# Patient Record
Sex: Male | Born: 1965 | Race: Black or African American | Hispanic: No | Marital: Married | Smoking: Never smoker
Health system: Southern US, Community
[De-identification: ages and names within clinical notes are randomized; demographics above are authoritative.]

## PROBLEM LIST (undated history)

## (undated) DIAGNOSIS — J45909 Unspecified asthma, uncomplicated: Secondary | ICD-10-CM

## (undated) DIAGNOSIS — R0683 Snoring: Secondary | ICD-10-CM

## (undated) DIAGNOSIS — G4733 Obstructive sleep apnea (adult) (pediatric): Principal | ICD-10-CM

## (undated) DIAGNOSIS — E785 Hyperlipidemia, unspecified: Secondary | ICD-10-CM

## (undated) DIAGNOSIS — R0681 Apnea, not elsewhere classified: Secondary | ICD-10-CM

## (undated) DIAGNOSIS — Z9989 Dependence on other enabling machines and devices: Principal | ICD-10-CM

## (undated) DIAGNOSIS — I10 Essential (primary) hypertension: Secondary | ICD-10-CM

## (undated) HISTORY — DX: Obstructive sleep apnea (adult) (pediatric): G47.33

## (undated) HISTORY — DX: Snoring: R06.83

## (undated) HISTORY — DX: Unspecified asthma, uncomplicated: J45.909

## (undated) HISTORY — DX: Apnea, not elsewhere classified: R06.81

## (undated) HISTORY — DX: Dependence on other enabling machines and devices: Z99.89

## (undated) HISTORY — DX: Hyperlipidemia, unspecified: E78.5

## (undated) HISTORY — PX: THROAT SURGERY: SHX803

---

## 2004-02-27 ENCOUNTER — Encounter: Admission: RE | Admit: 2004-02-27 | Discharge: 2004-02-27 | Payer: Self-pay | Admitting: Specialist

## 2007-09-20 ENCOUNTER — Ambulatory Visit (HOSPITAL_COMMUNITY): Admission: RE | Admit: 2007-09-20 | Discharge: 2007-09-20 | Payer: Self-pay | Admitting: Otolaryngology

## 2007-09-20 ENCOUNTER — Encounter (INDEPENDENT_AMBULATORY_CARE_PROVIDER_SITE_OTHER): Payer: Self-pay | Admitting: Otolaryngology

## 2010-11-09 NOTE — Op Note (Signed)
[name], Edward Erickson           ACCOUNT NO.:  [id number]   MEDICAL RECORD NO.:  [id number]          PATIENT TYPE:  AMB   LOCATION:  SDS                          FACILITY:  [facility]   PHYSICIAN:  Lucky Cowboy, MD         DATE OF BIRTH:  March 16, 1966   DATE OF PROCEDURE:  09/20/2007  DATE OF DISCHARGE:  09/20/2007                               OPERATIVE REPORT   PREOPERATIVE DIAGNOSIS:  Right vocal cord mass.   POSTOPERATIVE DIAGNOSIS:  Right vocal cord polyps.   PROCEDURE:  1. Suspension microlaryngoscopy with cold-knife excision and CO2      laser, the right vocal cord polyp.  2. Cidofovir injection of the right mid vocal cord.   SURGEON:  Lucky Cowboy, MD   ANESTHESIA:  General.   ESTIMATED BLOOD LOSS:  None.   COMPLICATIONS:  None.   INDICATIONS:  This patient is a 45 year old male with a several-year  history of significant hoarseness.  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   PROCEDURE:  The patient was taken to the operating room and placed on  the table in the supine position.  The table was then rotated  counterclockwise 90 degrees after he was placed under general  endotracheal anesthesia.  A Dedo was then placed intraorally with  exposure of the endolarynx.  The patient did have an upper tooth guard  placed for protection of the upper incisors.  The laryngoscope was then  suspended on a Lewy suspension arm.  The operating microscope was used  to visualize the endolarynx.  Afrin on cottonoid pledget was used for  vasoconstriction of the right vocal cord.  The right vocal cord lesion  was then grasped with a polyp holder.  This was reflected medially.  The  CO2 laser was used on 6 J in a repeat mode to carefully excise the mass.  This did appear to be more of a polyp, however, possibility of  papilloma  could not be clearly excluded.  Cidofovir was then injected with a fine  needle.  Lidocaine was sprayed on the cord to minimize the possibility  of laryngospasm.  The  laryngoscope was removed noting no damage to the teeth or soft tissues.  The table was rotated clockwise 90 degrees to its original position.  The patient was awakened from anesthesia and taken to the Post  Anesthesia Care Unit in stable condition.  There were no complications.      Lucky Cowboy, MD  Electronically Signed     SJ/MEDQ  D:  10/18/2007  T:  10/19/2007  Job:  [id number]   cc:   Florina Ou, MD

## 2011-03-21 LAB — BASIC METABOLIC PANEL
Calcium: 8.9
GFR calc Af Amer: >60
GFR calc non Af Amer: >60
Glucose, Bld: 97
Potassium: 4

## 2011-03-21 LAB — CBC
MCHC: 33.4
MCV: 82.3
RDW: 14.1

## 2011-03-21 LAB — PROTIME-INR: INR: 1

## 2011-03-21 LAB — HEPATIC FUNCTION PANEL
Albumin: 4.1
Alkaline Phosphatase: 95
Total Protein: 7.8

## 2012-05-12 ENCOUNTER — Emergency Department (HOSPITAL_BASED_OUTPATIENT_CLINIC_OR_DEPARTMENT_OTHER): Payer: No Typology Code available for payment source

## 2012-05-12 ENCOUNTER — Emergency Department (HOSPITAL_BASED_OUTPATIENT_CLINIC_OR_DEPARTMENT_OTHER)
Admission: EM | Admit: 2012-05-12 | Discharge: 2012-05-12 | Disposition: A | Discharge status: Home / Self Care | Payer: No Typology Code available for payment source | Attending: Emergency Medicine | Admitting: Emergency Medicine

## 2012-05-12 ENCOUNTER — Encounter (HOSPITAL_BASED_OUTPATIENT_CLINIC_OR_DEPARTMENT_OTHER): Payer: Self-pay | Admitting: *Deleted

## 2012-05-12 DIAGNOSIS — S39012A Strain of muscle, fascia and tendon of lower back, initial encounter: Secondary | ICD-10-CM

## 2012-05-12 DIAGNOSIS — Y92410 Unspecified street and highway as the place of occurrence of the external cause: Secondary | ICD-10-CM | POA: Insufficient documentation

## 2012-05-12 DIAGNOSIS — S335XXA Sprain of ligaments of lumbar spine, initial encounter: Secondary | ICD-10-CM | POA: Insufficient documentation

## 2012-05-12 DIAGNOSIS — S0993XA Unspecified injury of face, initial encounter: Secondary | ICD-10-CM | POA: Insufficient documentation

## 2012-05-12 DIAGNOSIS — Y9389 Activity, other specified: Secondary | ICD-10-CM | POA: Insufficient documentation

## 2012-05-12 DIAGNOSIS — I10 Essential (primary) hypertension: Secondary | ICD-10-CM | POA: Insufficient documentation

## 2012-05-12 HISTORY — DX: Essential (primary) hypertension: I10

## 2012-05-12 MED ORDER — HYDROCODONE-ACETAMINOPHEN 5-325 MG PO TABS: 2.0000 | ORAL_TABLET | Freq: Four times a day (QID) | ORAL | Status: DC | PRN

## 2012-05-12 MED ORDER — HYDROCODONE-ACETAMINOPHEN 5-325 MG PO TABS
2.0000 | ORAL_TABLET | Freq: Once | ORAL | Status: DC
  Filled 2012-05-12: qty 2
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
## 2012-05-12 NOTE — ED Provider Notes (Signed)
Medical screening examination/treatment/procedure(s) were performed by non-physician practitioner and as supervising physician I was immediately available for consultation/collaboration.   Ameya Vowell Y. Shakyra Mattera, MD 05/12/12 2127 
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
## 2013-07-26 ENCOUNTER — Encounter: Payer: Self-pay | Admitting: Neurology

## 2013-07-29 ENCOUNTER — Institutional Professional Consult (permissible substitution): Payer: Self-pay | Admitting: Neurology

## 2013-07-30 ENCOUNTER — Ambulatory Visit (INDEPENDENT_AMBULATORY_CARE_PROVIDER_SITE_OTHER): Payer: 59 | Admitting: Neurology

## 2013-07-30 ENCOUNTER — Encounter: Payer: Self-pay | Admitting: Neurology

## 2013-07-30 VITALS — BP 132/81 | HR 60 | Temp 97.6°F | Ht 66.0 in | Wt 214.0 lb

## 2013-07-30 DIAGNOSIS — R0902 Hypoxemia: Secondary | ICD-10-CM

## 2013-07-30 DIAGNOSIS — G4734 Idiopathic sleep related nonobstructive alveolar hypoventilation: Secondary | ICD-10-CM

## 2013-07-30 DIAGNOSIS — G4733 Obstructive sleep apnea (adult) (pediatric): Secondary | ICD-10-CM | POA: Insufficient documentation

## 2013-07-30 DIAGNOSIS — E669 Obesity, unspecified: Secondary | ICD-10-CM

## 2013-07-30 DIAGNOSIS — G4726 Circadian rhythm sleep disorder, shift work type: Secondary | ICD-10-CM

## 2013-07-30 DIAGNOSIS — R0981 Nasal congestion: Secondary | ICD-10-CM

## 2013-07-30 DIAGNOSIS — Z9989 Dependence on other enabling machines and devices: Principal | ICD-10-CM

## 2013-07-30 DIAGNOSIS — J3489 Other specified disorders of nose and nasal sinuses: Secondary | ICD-10-CM

## 2013-07-30 HISTORY — DX: Obstructive sleep apnea (adult) (pediatric): G47.33

## 2013-07-30 MED ORDER — FLUTICASONE PROPIONATE 50 MCG/ACT NA SUSP: 1.0000 | Freq: Every day | NASAL | Status: DC

## 2013-07-30 NOTE — Progress Notes (Signed)
Subjective:    Patient ID: Edward Erickson is a 48 y.o. male.  HPI    Star Age, MD, PhD [facility] [address] [place], [place] [address]  Dear Doren Custard,   I saw your patient, Edward Erickson, upon your kind request in my neurologic clinic today for initial consultation of his obstructive sleep apnea. The patient is unaccompanied today. As you know, Edward Erickson is a 48 year old right-handed gentleman with an underlying medical history of obesity, hypertension, anxiety, and depression, who was diagnosed with severe obstructive sleep apnea in 2011. He has been using a CPAP machine since then. I reviewed his sleep study performed at the [facility] on 04/26/2010: His sleep efficiency was 65.12% for the baseline portion of the sleep study. He had an AHI of 84.4 per hour. Average oxygen saturation was 90%, nadir was 72% in non-REM sleep at 57% and REM sleep. He was observed to snore loudly. He was placed on CPAP from 5 cm to 14 cm of water pressure. His AHI was 0 per hour on 14 cm of water pressure. His lowest oxygen saturation while on CPAP was 74%. He never got new supplies since 10/11. He brought his machine today but we could not download any data off of his card. His typical bedtime is reported in the daytime, as he works 3rd shift, from 7 PM ot 7 AM 3 days on, 4 days off, then 4 days on and 3 days off, and on his days off he sleeps during the night. He goes to bed around 9 AM to 5 PM on work days. He uses CPAP every night, all night and felt improved when he started. He travels to [place] once or twice a year and takes his CPAP, but he cannot find distilled water there. He does not smoke and drinks EtOH about 1 to 2 per week.  He drinks about 1 cup of tea in the morning and rare sodas.  Of note, he has gained weight since his sleep study, around 18 lb, as he was 196 lb in 10/11.  He denies frank excessive daytime  somnolence (EDS) and His Epworth Sleepiness Score (ESS) is 6/24 today. He has not fallen asleep while driving. The patient has not been taking a planned nap.  He has been known to snore for the past many years. The patient has not noted any RLS symptoms and is not known to kick while asleep or before falling asleep. There is family history of suspected of OSA.  He denies cataplexy, sleep paralysis, hypnagogic or hypnopompic hallucinations, or sleep attacks. He does not report any vivid dreams, nightmares, dream enactments, or parasomnias, such as sleep talking or sleep walking. The patient has not had a sleep study since 2011. His bedroom is usually dark and cool. There is no TV in the bedroom.   His Past Medical History Is Significant For: Past Medical History  Diagnosis Date  . Hypertension   . Asthma   . Apnea   . Snoring   . Hyperlipidemia     mild  . OSA on CPAP 07/30/2013    His Past Surgical History Is Significant For: History reviewed. No pertinent past surgical history.  His Family History Is Significant For: Family History  Problem Relation Age of Onset  . Hypertension Mother   . Hypertension Father   . Snoring Brother     all 3 brothers  . Snoring Sister     all  3 sisters    His Social History Is Significant For: History   Social History  . Marital Status: Legally Separated    Spouse Name: N/A    Number of Children: N/A  . Years of Education: N/A   Social History Main Topics  . Smoking status: Never Smoker   . Smokeless tobacco: None  . Alcohol Use: Yes  . Drug Use: No  . Sexual Activity: None   Other Topics Concern  . None   Social History Narrative  . None    His Allergies Are:  No Known Allergies:   His Current Medications Are:  Outpatient Encounter Prescriptions as of 07/30/2013  Medication Sig  . lisinopril-hydrochlorothiazide (PRINZIDE,ZESTORETIC) 20-12.5 MG per tablet Take 1 tablet by mouth daily.  . metoprolol (LOPRESSOR) 50 MG tablet Take 50  mg by mouth 2 (two) times daily.  . sertraline (ZOLOFT) 50 MG tablet Take 50 mg by mouth daily.  . [DISCONTINUED] amLODipine (NORVASC) 10 MG tablet Take 10 mg by mouth daily.  . [DISCONTINUED] cyclobenzaprine (FLEXERIL) 5 MG tablet Take 5 mg by mouth 3 (three) times daily as needed for muscle spasms.  . [DISCONTINUED] gabapentin (NEURONTIN) 300 MG capsule Take 300 mg by mouth 3 (three) times daily.  . [DISCONTINUED] HYDROcodone-acetaminophen (NORCO/VICODIN) 5-325 MG per tablet Take 2 tablets by mouth every 6 (six) hours as needed for pain.  . [DISCONTINUED] Ketoprofen CR (KETOPROFEN CR) 200 MG CP24 capsule SR 24 hr Take 200 mg by mouth daily.  :  Review of Systems:  Out of a complete 14 point review of systems, all are reviewed and negative with the exception of these symptoms as listed below: Review of Systems  Constitutional: Negative.   HENT: Negative.   Eyes: Positive for pain and visual disturbance (blurred vision).  Respiratory: Negative.   Cardiovascular: Negative.   Gastrointestinal: Negative.   Endocrine: Negative.   Genitourinary: Negative.   Musculoskeletal: Negative.   Skin: Negative.   Allergic/Immunologic: Positive for environmental allergies.  Neurological: Negative.   Hematological: Negative.   Psychiatric/Behavioral: Positive for sleep disturbance (apnea, snoring, not enough sleep).    Objective:  Neurologic Exam  Physical Exam Physical Examination:   Filed Vitals:   07/30/13 1013  BP: 132/81  Pulse: 60  Temp: 97.6 F (36.4 C)    General Examination: The patient is a very pleasant 48 y.o. male in no acute distress. He appears well-developed and well-nourished and adequately groomed. He is obesity.   HEENT: Normocephalic, atraumatic, pupils are equal, round and reactive to light and accommodation. Funduscopic exam is normal with sharp disc margins noted. Extraocular tracking is good without limitation to gaze excursion or nystagmus noted. Normal smooth  pursuit is noted. Hearing is grossly intact. Tympanic membranes are clear bilaterally. Face is symmetric with normal facial animation and normal facial sensation. Speech is clear with no dysarthria noted. There is no hypophonia. There is no lip, neck/head, jaw or voice tremor. Neck is supple with full range of passive and active motion. There are no carotid bruits on auscultation. Oropharynx exam reveals: moderate mouth dryness, adequate dental hygiene and marked airway crowding, due to large tongue, long uvula and tonsils. Mallampati is class I. Tongue protrudes centrally and palate elevates symmetrically. Tonsils are 2+ in size. Neck size is 16 inches. She has no overbite. Nasal inspection reveals significant nasal mucosal bogginess and bilateral inferior turbinate hypertrophy.   Chest: Clear to auscultation without wheezing, rhonchi or crackles noted.  Heart: S1+S2+0, regular and normal without murmurs, rubs or  gallops noted.   Abdomen: Soft, non-tender and non-distended with normal bowel sounds appreciated on auscultation.  Extremities: There is no pitting edema in the distal lower extremities bilaterally. Pedal pulses are intact.  Skin: Warm and dry without trophic changes noted. There are no varicose veins.  Musculoskeletal: exam reveals no obvious joint deformities, tenderness or joint swelling or erythema.   Neurologically:  Mental status: The patient is awake, alert and oriented in all 4 spheres. His immediate and remote memory, attention, language skills and fund of knowledge are appropriate. There is no evidence of aphasia, agnosia, apraxia or anomia. Speech is clear with normal prosody and enunciation. Thought process is linear. Mood is normal and affect is normal.  Cranial nerves II - XII are as described above under HEENT exam. In addition: shoulder shrug is normal with equal shoulder height noted. Motor exam: Normal bulk, strength and tone is noted. There is no drift, tremor or  rebound. Romberg is negative. Reflexes are 2+ throughout. Babinski: Toes are flexor bilaterally. Fine motor skills and coordination: intact with normal finger taps, normal hand movements, normal rapid alternating patting, normal foot taps and normal foot agility.  Cerebellar testing: No dysmetria or intention tremor on finger to nose testing. Heel to shin is unremarkable bilaterally. There is no truncal or gait ataxia.  Sensory exam: intact to light touch, pinprick, vibration, temperature sense and proprioception in the upper and lower extremities.  Gait, station and balance: He stands easily. No veering to one side is noted. No leaning to one side is noted. Posture is age-appropriate and stance is narrow based. Gait shows normal stride length and normal pace. No problems turning are noted. He turns en bloc. Tandem walk is unremarkable. Intact toe and heel stance is noted.               Assessment and Plan:   In summary, Edward Erickson is a very pleasant 48 y.o.-year old male with a history obesity, hypertension, anxiety, and depression, who was diagnosed with severe obstructive sleep apnea in 2011. He has not had new supplies since 2011. Unfortunately, he has gained a significant amount of weight since the last sleep study, which may mean worsening of his OSA and need for pressure adjustment. He also has a significantly tight nasal passage and mucosal swelling with inferior turbinate hypertrophy. I will ask him to try Flonase.  I had a long chat with the patient about my findings and the diagnosis of severe OSA, its prognosis and treatment options. We talked about medical treatments and non-pharmacological approaches. I explained in particular the risks and ramifications of untreated moderate to severe OSA, especially with respect to developing cardiovascular disease down the Road, including congestive heart failure, difficult to treat hypertension, cardiac arrhythmias, or stroke. Even type 2 diabetes  has in part been linked to untreated OSA. We talked about trying to maintain a healthy lifestyle in general, as well as the importance of weight control. I encouraged the patient to eat healthy, exercise daily and keep well hydrated, to keep a scheduled bedtime and wake time routine, to not skip any meals and eat healthy snacks in between meals. He is strongly encouraged to pursue weight loss. I recommended the following at this time: sleep study with potential positive airway pressure titration. 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 We will try to accommodate him on a Saturday night. I explained the sleep test procedure to the patient and also outlined possible surgical and non-surgical treatment options  of OSA, including the use of a custom-made dental device, upper airway surgical options, such as pillar implants, radiofrequency surgery, tongue base surgery, and UPPP. I also explained the CPAP treatment option to the patient, who indicated that he would like to continue using CPAP. I explained the importance of being compliant with PAP treatment, not only for insurance purposes but primarily to improve His symptoms, and for the patient's long term health benefit, including to reduce His cardiovascular risks. I answered all his questions today and the patient was in agreement. I would like to see him back after the sleep study is completed and encouraged him to call with any interim questions, concerns, problems or updates.   Thank you very much for allowing me to participate in the care of this nice patient. If I can be of any further assistance to you please do not hesitate to call me at [phone].  Sincerely,   Star Age, MD, PhD

## 2013-07-30 NOTE — Patient Instructions (Addendum)
We will bring you back for a split-night sleep study to reevaluate you for your obstructive sleep apnea, treatment adjustment, and new supplies. Please start using flonase nasal spray as directed.

## 2013-08-08 ENCOUNTER — Ambulatory Visit (INDEPENDENT_AMBULATORY_CARE_PROVIDER_SITE_OTHER): Payer: 59

## 2013-08-08 DIAGNOSIS — Z9989 Dependence on other enabling machines and devices: Principal | ICD-10-CM

## 2013-08-08 DIAGNOSIS — E669 Obesity, unspecified: Secondary | ICD-10-CM

## 2013-08-08 DIAGNOSIS — G4733 Obstructive sleep apnea (adult) (pediatric): Secondary | ICD-10-CM

## 2013-08-08 DIAGNOSIS — G4734 Idiopathic sleep related nonobstructive alveolar hypoventilation: Secondary | ICD-10-CM

## 2013-08-08 DIAGNOSIS — G4726 Circadian rhythm sleep disorder, shift work type: Secondary | ICD-10-CM

## 2013-08-08 DIAGNOSIS — G479 Sleep disorder, unspecified: Secondary | ICD-10-CM

## 2013-08-16 ENCOUNTER — Telehealth: Payer: Self-pay | Admitting: Neurology

## 2013-08-16 DIAGNOSIS — G4733 Obstructive sleep apnea (adult) (pediatric): Secondary | ICD-10-CM

## 2013-08-16 NOTE — Telephone Encounter (Signed)
Please call and notify patient that the recent sleep study confirmed the diagnosis of OSA. He did very well with CPAP during the study with significant improvement of the respiratory events. Therefore, I would like start the patient on CPAP at home. I placed the order in the chart.   Arrange for CPAP set up at home through a DME company of patient's choice and fax/route report to PCP and referring MD (if other than PCP).   The patient will also need a follow up appointment with me in 6-8 weeks post set up that has to be scheduled; help the patient schedule this (in a follow-up slot).   Please re-enforce the importance of compliance with treatment and the need for us to monitor compliance data.   Once you have spoken to the patient and scheduled the return appointment, you may close this encounter, thanks,   Elzy Tomasello, MD, PhD [facility] ([facility]    

## 2013-08-23 ENCOUNTER — Encounter: Payer: Self-pay | Admitting: *Deleted

## 2013-08-23 NOTE — Telephone Encounter (Signed)
I tried calling patient about his recent sleep study results. unable to leave a message. I will mail results along with a follow up instruction letter to the patient and fax a copy of the report to Anders Grant, [place] office. I will send CPAP order to [facility].

## 2013-10-10 ENCOUNTER — Encounter: Payer: Self-pay | Admitting: Neurology

## 2013-10-11 ENCOUNTER — Telehealth: Payer: Self-pay | Admitting: Neurology

## 2013-10-11 ENCOUNTER — Ambulatory Visit: Payer: 59 | Admitting: Neurology

## 2013-10-11 NOTE — Telephone Encounter (Signed)
Pt has an appt already with Dr. Rexene Alberts on 10/14/13 at 9:30 am.

## 2013-10-11 NOTE — Telephone Encounter (Signed)
Patient came to his appointment 40 minutes late and needed to be rescheduled.  He refused to reschedule at any other time than 8:30 am and there is nothing available. He would like a call back to get in sooner.

## 2013-10-14 ENCOUNTER — Encounter: Payer: Self-pay | Admitting: Neurology

## 2013-10-14 ENCOUNTER — Ambulatory Visit (INDEPENDENT_AMBULATORY_CARE_PROVIDER_SITE_OTHER): Payer: 59 | Admitting: Neurology

## 2013-10-14 ENCOUNTER — Encounter (INDEPENDENT_AMBULATORY_CARE_PROVIDER_SITE_OTHER): Payer: Self-pay

## 2013-10-14 VITALS — BP 131/86 | HR 63 | Temp 98.3°F | Ht 66.0 in | Wt 212.0 lb

## 2013-10-14 DIAGNOSIS — G4726 Circadian rhythm sleep disorder, shift work type: Secondary | ICD-10-CM

## 2013-10-14 DIAGNOSIS — J3489 Other specified disorders of nose and nasal sinuses: Secondary | ICD-10-CM

## 2013-10-14 DIAGNOSIS — G4733 Obstructive sleep apnea (adult) (pediatric): Secondary | ICD-10-CM

## 2013-10-14 DIAGNOSIS — R0981 Nasal congestion: Secondary | ICD-10-CM

## 2013-10-14 DIAGNOSIS — E669 Obesity, unspecified: Secondary | ICD-10-CM

## 2013-10-14 DIAGNOSIS — Z9989 Dependence on other enabling machines and devices: Principal | ICD-10-CM

## 2013-10-14 NOTE — Patient Instructions (Addendum)
Please continue using your CPAP regularly. While your insurance requires that you use CPAP at least 4 hours each night on 70% of the nights, I recommend, that you not skip any nights and use it throughout the night if you can. Getting used to CPAP does take time and patience and discipline. Untreated obstructive sleep apnea when it is moderate to severe can have an adverse impact on cardiovascular health and raise her risk for heart disease, arrhythmias, hypertension, congestive heart failure, stroke and diabetes. Untreated obstructive sleep apnea causes sleep disruption, nonrestorative sleep, and sleep deprivation. This can have an impact on your day to day functioning and cause daytime sleepiness and impairment of cognitive function, memory loss, mood disturbance, and problems focussing. Using CPAP regularly can improve these symptoms.  Keep up the good work! I will see you back in 6 months for sleep apnea check up, and if you continue to do well on CPAP I will see you once a year thereafter.   Use your Flonase and use a salt water nasal rinse system, such as the [employer] and follow the directions and my instructions. It may help your nasal congestion and help you tolerate your CPAP.

## 2013-10-14 NOTE — Progress Notes (Signed)
Subjective:    Patient ID: TENZIN EDELMAN is a 48 y.o. male.  HPI    Interim history:   Mr. Parekh is a 48 year old right-handed gentleman with an underlying medical history of obesity, hypertension, anxiety, and depression, who presents for followup consultation of his OSA. He is unaccompanied today. I first met him on 07/30/2013, at which time he reported a diagnosis of severe obstructive sleep apnea in 2011. He has been using CPAP since then. He reported significant weight gain since his last sleep study and need for new supplies. I suggested he start using Flonase for significantly tight nasal passages and asked him to return for a sleep study. He had a split-night sleep study on 08/08/2013 and I went over her sleep study results with him in detail today. His baseline sleep efficiency was 82% with a latency to sleep of 13.5 minutes and wake after sleep onset of 14 minutes with an increased arousal index. He had an increased percentage of stage II sleep and a reduced percentage of deep sleep and absence of REM sleep. He had loud snoring. His total AHI was 66.5 per hour. Baseline oxygen saturation was 93% with a nadir of 81%. He was then titrated on CPAP from 5-8 cm of water pressure. His AHI was reduced to 0 events per hour on the final pressure. His sleep efficiency was 84%. He had an increased percentage of stage II sleep, 13.9% of deep sleep and 17.4% of REM sleep after CPAP. He had an average oxygen saturation of 96% with a nadir of 90%. He had no significant periodic leg movements of sleep or EKG changes. Based on the sleep test results I started him on CPAP at 8 cm with a nasal mask, size medium. Today, I reviewed his compliance data from 09/04/2013 through 10/13/2013 which is the last 40 days during which time he his CPAP every night except for one night. Percent used days greater than 4 hours was 85%, indicating very good compliance. Residual AHI was low at 1.2 per hour and air leak was  low at 8 L per minute for the 95th percentile. Pressure again is 8 cm with EPR of 1. His average usage was 6 hours and 46 minutes.   Today, he reports doing well with his new CPAP. He has adjusted will and is compliant with treatment, he has no new complaints and sleeps well. He things this new machine is better. His wife and 2 kids are in [place].  I reviewed his sleep study performed at the [facility] on 04/26/2010: His sleep efficiency was 65.12% for the baseline portion of the sleep study. He had an AHI of 84.4 per hour. Average oxygen saturation was 90%, nadir was 72% in non-REM sleep at 57% and REM sleep. He was observed to snore loudly. He was placed on CPAP from 5 cm to 14 cm of water pressure. His AHI was 0 per hour on 14 cm of water pressure. His lowest oxygen saturation while on CPAP was 74%. He never got new supplies since 10/11.  His typical bedtime is reported in the daytime, as he works 3rd shift, from 7 PM ot 7 AM 3 days on, 4 days off, then 4 days on and 3 days off, and on his days off he sleeps during the night. He goes to bed around 9 AM to 5 PM on work days. He uses CPAP every night, all night and felt improved when he started. He travels to [place]  once or twice a year and takes his CPAP, but he cannot find distilled water there.   His Past Medical History Is Significant For: Past Medical History  Diagnosis Date  . Hypertension   . Asthma   . Apnea   . Snoring   . Hyperlipidemia     mild  . OSA on CPAP 07/30/2013    His Past Surgical History Is Significant For: No past surgical history on file.  His Family History Is Significant For: Family History  Problem Relation Age of Onset  . Hypertension Mother   . Hypertension Father   . Snoring Brother     all 3 brothers  . Snoring Sister     all 3 sisters    His Social History Is Significant For: History   Social History  . Marital Status: Legally Separated    Spouse Name: N/A    Number of  Children: N/A  . Years of Education: N/A   Social History Main Topics  . Smoking status: Never Smoker   . Smokeless tobacco: None  . Alcohol Use: Yes  . Drug Use: No  . Sexual Activity: None   Other Topics Concern  . None   Social History Narrative  . None    His Allergies Are:  No Known Allergies:   His Current Medications Are:  Outpatient Encounter Prescriptions as of 10/14/2013  Medication Sig  . amLODipine (NORVASC) 10 MG tablet Take 1 tablet by mouth daily.  . fluticasone (FLONASE) 50 MCG/ACT nasal spray Place 1 spray into both nostrils at bedtime.  Marland Kitchen lisinopril-hydrochlorothiazide (PRINZIDE,ZESTORETIC) 20-12.5 MG per tablet Take 1 tablet by mouth daily.  Marland Kitchen LOTEMAX 0.5 % ophthalmic suspension Place 1 drop into both eyes 4 (four) times daily.  . metoprolol (LOPRESSOR) 50 MG tablet Take 50 mg by mouth 2 (two) times daily.  . sertraline (ZOLOFT) 50 MG tablet Take 50 mg by mouth daily.  :  Review of Systems:  Out of a complete 14 point review of systems, all are reviewed and negative with the exception of these symptoms as listed below:  Review of Systems  Constitutional: Negative.   HENT: Negative.   Eyes: Negative.   Respiratory: Negative.   Cardiovascular: Negative.   Gastrointestinal: Negative.   Endocrine: Negative.   Genitourinary: Negative.   Musculoskeletal: Negative.   Skin: Negative.   Allergic/Immunologic: Negative.   Neurological: Negative.   Hematological: Negative.   Psychiatric/Behavioral: Negative.   All other systems reviewed and are negative.   Objective:  Neurologic Exam  Physical Exam Physical Examination:   Filed Vitals:   10/14/13 0931  BP: 131/86  Pulse: 63  Temp: 98.3 F (36.8 C)    General Examination: The patient is a very pleasant 48 y.o. male in no acute distress. He appears well-developed and well-nourished and adequately groomed. He is obese.   HEENT: Normocephalic, atraumatic, pupils are equal, round and reactive to  light and accommodation. Funduscopic exam is normal with sharp disc margins noted. Extraocular tracking is good without limitation to gaze excursion or nystagmus noted. Normal smooth pursuit is noted. Hearing is grossly intact. Tympanic membranes are clear bilaterally. Face is symmetric with normal facial animation and normal facial sensation. Speech is clear with no dysarthria noted. There is no hypophonia. There is no lip, neck/head, jaw or voice tremor. Neck is supple with full range of passive and active motion. There are no carotid bruits on auscultation. Oropharynx exam reveals: moderate mouth dryness, adequate dental hygiene and marked airway crowding, due  to large tongue, long uvula and tonsils. Mallampati is class I. Tongue protrudes centrally and palate elevates symmetrically. Tonsils are 2+ in size. Neck size is 16 inches. She has no overbite. Nasal inspection reveals significant nasal mucosal bogginess and bilateral inferior turbinate hypertrophy.   Chest: Clear to auscultation without wheezing, rhonchi or crackles noted.  Heart: S1+S2+0, regular and normal without murmurs, rubs or gallops noted.   Abdomen: Soft, non-tender and non-distended with normal bowel sounds appreciated on auscultation.  Extremities: There is no pitting edema in the distal lower extremities bilaterally. Pedal pulses are intact.  Skin: Warm and dry without trophic changes noted. There are no varicose veins.  Musculoskeletal: exam reveals no obvious joint deformities, tenderness or joint swelling or erythema.   Neurologically:  Mental status: The patient is awake, alert and oriented in all 4 spheres. His immediate and remote memory, attention, language skills and fund of knowledge are appropriate. There is no evidence of aphasia, agnosia, apraxia or anomia. Speech is clear with normal prosody and enunciation. Thought process is linear. Mood is normal and affect is normal.  Cranial nerves II - XII are as described  above under HEENT exam. In addition: shoulder shrug is normal with equal shoulder height noted. Motor exam: Normal bulk, strength and tone is noted. There is no drift, tremor or rebound. Romberg is negative. Reflexes are 2+ throughout. Babinski: Toes are flexor bilaterally. Fine motor skills and coordination: intact with normal finger taps, normal hand movements, normal rapid alternating patting, normal foot taps and normal foot agility.  Cerebellar testing: No dysmetria or intention tremor on finger to nose testing. Heel to shin is unremarkable bilaterally. There is no truncal or gait ataxia.  Sensory exam: intact to light touch, pinprick, vibration, temperature sense in the upper and lower extremities.  Gait, station and balance: He stands easily. No veering to one side is noted. No leaning to one side is noted. Posture is age-appropriate and stance is narrow based. Gait shows normal stride length and normal pace. No problems turning are noted. He turns en bloc. Tandem walk is unremarkable. Intact toe and heel stance is noted.                     Assessment and Plan:   In summary, LUE DUBUQUE is a very pleasant 48 y.o.-year old male with an underlying medical history of obesity, hypertension, anxiety, and depression, who presents for followup consultation of his severe OSA. He is established on CPAP therapy for the past years and we confirmed his severe sleep apnea diagnosis recently with a split-night sleep study. I reviewed his test results with him in detail today as well as went over his compliance it, explaining the numbers. I congratulated him on his great compliance and encouraged him to continue to use CPAP regularly to help reduce his cardiovascular risk. He is pleased with how he is doing and endorses good results with CPAP and great improvement with the new machine. He believes switching to nasal pillows was also a great solution for his mask tolerance. He does have nasal congestion and  uses Flonase. I also encouraged him to use a nasal rinse system. His exam is stable. I reassured them in that regard.  We also talked about trying to maintaining a healthy lifestyle in general. I encouraged the patient to eat healthy, exercise daily and keep well hydrated, to keep a scheduled bedtime and wake time routine, to not skip any meals and eat healthy snacks in  between meals and to have protein with every meal. I stressed the importance of regular exercise.   I answered all his questions today and the patient was in agreement with the above outlined plan. I would like to see the patient back in 6 months, sooner if the need arises and encouraged him to call with any interim questions, concerns, problems or updates.

## 2014-04-15 ENCOUNTER — Encounter: Payer: Self-pay | Admitting: Neurology

## 2014-04-16 ENCOUNTER — Telehealth: Payer: Self-pay | Admitting: Neurology

## 2014-04-16 ENCOUNTER — Ambulatory Visit: Payer: 59 | Admitting: Neurology

## 2014-04-16 NOTE — Telephone Encounter (Signed)
Patient no showed for an appointment today, 04/16/2014, at 1130.

## 2014-04-23 NOTE — Progress Notes (Signed)
Quick Note:  I reviewed the patient's CPAP compliance data from 01/16/2014 to 04/15/2014, which is a total of 90 days, during which time the patient used CPAP every day except for 1 day. The average usage for all days was 6 hours and 33 minutes. The percent used days greater than 4 hours was 83 %, indicating very good compliance. The residual AHI was 1.1 per hour, indicating an appropriate treatment pressure of 8 cwp with EPR off. Air leak from the mask was acceptable. I will review this data with the patient at the next office visit, which is currently not scheduled. Please get in touch with patient regarding his appointment.  Star Age, MD, PhD [facility] ([facility])  ______

## 2014-08-30 ENCOUNTER — Ambulatory Visit (INDEPENDENT_AMBULATORY_CARE_PROVIDER_SITE_OTHER): Payer: 59 | Admitting: Family Medicine

## 2014-08-30 VITALS — BP 130/80 | HR 61 | Temp 98.0°F | Ht 65.5 in | Wt 221.1 lb

## 2014-08-30 DIAGNOSIS — F411 Generalized anxiety disorder: Secondary | ICD-10-CM

## 2014-08-30 DIAGNOSIS — K5900 Constipation, unspecified: Secondary | ICD-10-CM

## 2014-08-30 DIAGNOSIS — R0981 Nasal congestion: Secondary | ICD-10-CM | POA: Diagnosis not present

## 2014-08-30 DIAGNOSIS — N529 Male erectile dysfunction, unspecified: Secondary | ICD-10-CM

## 2014-08-30 DIAGNOSIS — H1013 Acute atopic conjunctivitis, bilateral: Secondary | ICD-10-CM | POA: Diagnosis not present

## 2014-08-30 DIAGNOSIS — I10 Essential (primary) hypertension: Secondary | ICD-10-CM

## 2014-08-30 LAB — COMPREHENSIVE METABOLIC PANEL
ALK PHOS: 105 U/L (ref 39–117)
ALT: 21 U/L (ref 0–53)
AST: 26 U/L (ref 0–37)
Albumin: 4.2 g/dL (ref 3.5–5.2)
BILIRUBIN TOTAL: 0.5 mg/dL (ref 0.2–1.2)
BUN: 13 mg/dL (ref 6–23)
CO2: 29 mEq/L (ref 19–32)
Calcium: 9.1 mg/dL (ref 8.4–10.5)
Chloride: 98 mEq/L (ref 96–112)
Creat: 0.89 mg/dL (ref 0.50–1.35)
GLUCOSE: 83 mg/dL (ref 70–99)
POTASSIUM: 3.4 meq/L — AB (ref 3.5–5.3)
SODIUM: 138 meq/L (ref 135–145)
Total Protein: 8.1 g/dL (ref 6.0–8.3)

## 2014-08-30 LAB — LIPID PANEL
CHOL/HDL RATIO: 3.9 ratio
Cholesterol: 210 mg/dL — ABNORMAL HIGH (ref 0–200)
HDL: 54 mg/dL (ref >=40)
LDL Cholesterol: 125 mg/dL — ABNORMAL HIGH (ref 0–99)
TRIGLYCERIDES: 153 mg/dL — AB (ref <150)
VLDL: 31 mg/dL (ref 0–40)

## 2014-08-30 LAB — TSH: TSH: 0.501 u[IU]/mL (ref 0.350–4.500)

## 2014-08-30 MED ORDER — AMLODIPINE BESYLATE 10 MG PO TABS: 10.0000 mg | ORAL_TABLET | Freq: Every day | ORAL | Status: DC

## 2014-08-30 MED ORDER — LISINOPRIL-HYDROCHLOROTHIAZIDE 20-12.5 MG PO TABS: 1.0000 | ORAL_TABLET | Freq: Every day | ORAL | Status: DC

## 2014-08-30 MED ORDER — OLOPATADINE HCL 0.1 % OP SOLN: 1.0000 [drp] | Freq: Two times a day (BID) | OPHTHALMIC | Status: DC

## 2014-08-30 MED ORDER — METOPROLOL TARTRATE 50 MG PO TABS: 50.0000 mg | ORAL_TABLET | Freq: Two times a day (BID) | ORAL | Status: DC

## 2014-08-30 MED ORDER — SERTRALINE HCL 50 MG PO TABS: 50.0000 mg | ORAL_TABLET | Freq: Every day | ORAL | Status: DC

## 2014-08-30 NOTE — Progress Notes (Signed)
Subjective: Patient is here for the first time. He has been having more trouble with his eyes itching. He tried some drops that his eye doctor gave him he didn't help a lot. He has had some nasal congestion also. He does not smoke. He is from [place]. He has a history of hypertension and is on several medications for that. He wonders whether he needs them all. He feels pretty well. No complaints on review of systems. Cardiovascular unremarkable. Respiratory unremarkable. GI unremarkable except for a long history of constipation. Milk of magnesia does not seem to keep him clear. GU unremarkable. Musculoskeletal unremarkable. He's not had any other major illnesses or injuries. He lives in the [place] now. He visits, about once a year. He has had some problems with erectile dysfunction that someone treated him with Zoloft in the past, and does well on that. I think that they felt like he was having some anxiety also.   Objective: No acute distress. Eyes slightly injected. Throat clear. Neck supple without nodes. Chest clear. Heart regular without murmurs. And soft without mass or tenderness.  Assessment: Hypertension, controlled Allergic conjunctivitis History of erectile dysfunction History constipation Anxiety  Plan: Represcribed all his medications. Return in 6 months.

## 2014-08-30 NOTE — Patient Instructions (Signed)
Use the eyedrops 1 drop in each eye twice daily if needed for itching  Take over-the-counter fexofenadine (Allegra) 1 pill daily for allergies  Take MiraLAX one dose daily as needed for constipation. You can take it less often if your bowels are moving well, maybe every 2 or 3 days. It is a powder that you makes in a glass of water and drink it down.  Make sure you eat plenty of fruits and vegetables and do some regular walking and drink lots of water in order to help keep you from being constipated  Continue your current blood pressure medications  Continue your current sertraline  Return if worse at any time. If you are doing well I would like to recheck you some time this summer in about July or August. You can call the office and ask when Dr. Linna Darner will be here, and come here on my schedule. I do not take appointments and you have to wait your turn.

## 2014-09-02 ENCOUNTER — Encounter: Payer: Self-pay | Admitting: Family Medicine

## 2014-09-09 ENCOUNTER — Other Ambulatory Visit: Payer: Self-pay | Admitting: Family Medicine

## 2014-09-09 MED ORDER — POTASSIUM CHLORIDE CRYS ER 20 MEQ PO TBCR: 20.0000 meq | EXTENDED_RELEASE_TABLET | Freq: Every day | ORAL | Status: DC

## 2014-09-09 NOTE — Telephone Encounter (Signed)
Sent in [place] 20mg  with  5 refills and gave patient results

## 2014-11-19 ENCOUNTER — Telehealth: Payer: Self-pay

## 2014-11-19 NOTE — Telephone Encounter (Signed)
[facility] requested office visit notes to be faxed to them after the date of 09/05/13. I faxed them the office visits from 10/14/13.

## 2015-03-07 ENCOUNTER — Other Ambulatory Visit: Payer: Self-pay | Admitting: Family Medicine

## 2015-04-09 ENCOUNTER — Other Ambulatory Visit: Payer: Self-pay | Admitting: Physician Assistant

## 2015-05-09 ENCOUNTER — Other Ambulatory Visit: Payer: Self-pay | Admitting: Physician Assistant

## 2015-05-20 ENCOUNTER — Other Ambulatory Visit: Payer: Self-pay | Admitting: Family Medicine

## 2015-05-22 ENCOUNTER — Ambulatory Visit (INDEPENDENT_AMBULATORY_CARE_PROVIDER_SITE_OTHER): Payer: 59 | Admitting: Family Medicine

## 2015-05-22 VITALS — BP 132/80 | HR 66 | Temp 98.2°F | Resp 18 | Ht 66.0 in | Wt 216.0 lb

## 2015-05-22 DIAGNOSIS — R0981 Nasal congestion: Secondary | ICD-10-CM

## 2015-05-22 DIAGNOSIS — F411 Generalized anxiety disorder: Secondary | ICD-10-CM

## 2015-05-22 DIAGNOSIS — N529 Male erectile dysfunction, unspecified: Secondary | ICD-10-CM

## 2015-05-22 DIAGNOSIS — I10 Essential (primary) hypertension: Secondary | ICD-10-CM | POA: Diagnosis not present

## 2015-05-22 DIAGNOSIS — E785 Hyperlipidemia, unspecified: Secondary | ICD-10-CM | POA: Diagnosis not present

## 2015-05-22 DIAGNOSIS — M7122 Synovial cyst of popliteal space [Baker], left knee: Secondary | ICD-10-CM | POA: Diagnosis not present

## 2015-05-22 DIAGNOSIS — M25562 Pain in left knee: Secondary | ICD-10-CM | POA: Diagnosis not present

## 2015-05-22 MED ORDER — METOPROLOL TARTRATE 50 MG PO TABS: 50.0000 mg | ORAL_TABLET | Freq: Two times a day (BID) | ORAL | Status: DC

## 2015-05-22 MED ORDER — AMLODIPINE BESYLATE 10 MG PO TABS: 10.0000 mg | ORAL_TABLET | Freq: Every day | ORAL | Status: DC

## 2015-05-22 MED ORDER — FLUTICASONE PROPIONATE 50 MCG/ACT NA SUSP: 1.0000 | Freq: Every day | NASAL | Status: DC

## 2015-05-22 MED ORDER — POTASSIUM CHLORIDE CRYS ER 20 MEQ PO TBCR: EXTENDED_RELEASE_TABLET | ORAL | Status: DC

## 2015-05-22 MED ORDER — DICLOFENAC SODIUM 75 MG PO TBEC: 75.0000 mg | DELAYED_RELEASE_TABLET | Freq: Two times a day (BID) | ORAL | Status: DC

## 2015-05-22 MED ORDER — LISINOPRIL-HYDROCHLOROTHIAZIDE 20-12.5 MG PO TABS: 1.0000 | ORAL_TABLET | Freq: Every day | ORAL | Status: DC

## 2015-05-22 MED ORDER — SERTRALINE HCL 50 MG PO TABS: 50.0000 mg | ORAL_TABLET | Freq: Every day | ORAL | Status: DC

## 2015-05-22 NOTE — Patient Instructions (Addendum)
Return 6 months  Continue current medications  Take diclofenac one twice daily for the pain and inflammation in the knee. If symptoms are not improved please return.  You're being scheduled for an ultrasound of your knee. You should hear from our office next week. If for any reason you do not hear from them please call and speak to the referral's.  Baker Cyst A Baker cyst is a sac-like structure that forms in the back of the knee. It is filled with the same fluid that is located in your knee. This fluid lubricates the bones and cartilage of the knee and allows them to move over each other more easily. CAUSES  When the knee becomes injured or inflamed, increased fluid forms in the knee. When this happens, the joint lining is pushed out behind the knee and forms the Baker cyst. This cyst may also be caused by inflammation from arthritic conditions and infections. SIGNS AND SYMPTOMS  A Baker cyst usually has no symptoms. When the cyst is substantially enlarged:  You may feel pressure behind the knee, stiffness in the knee, or a mass in the area behind the knee.  You may develop pain, redness, and swelling in the calf. This can suggest a blood clot and requires evaluation by your health care provider. DIAGNOSIS  A Baker cyst is most often found during an ultrasound exam. This exam may have been performed for other reasons, and the cyst was found incidentally. Sometimes an MRI is used. This picks up other problems within a joint that an ultrasound exam may not. If the Baker cyst developed immediately after an injury, X-ray exams may be used to diagnose the cyst. TREATMENT  The treatment depends on the cause of the cyst. Anti-inflammatory medicines and rest often will be prescribed. If the cyst is caused by a bacterial infection, antibiotic medicines may be prescribed.  HOME CARE INSTRUCTIONS   If the cyst was caused by an injury, for the first 24 hours, keep the injured leg elevated on 2 pillows  while lying down.  For the first 24 hours while you are awake, apply ice to the injured area:  Put ice in a plastic bag.  Place a towel between your skin and the bag.  Leave the ice on for 20 minutes, 2-3 times a day.  Only take over-the-counter or prescription medicines for pain, discomfort, or fever as directed by your health care provider.  Only take antibiotic medicine as directed. Make sure to finish it even if you start to feel better. MAKE SURE YOU:   Understand these instructions.  Will watch your condition.  Will get help right away if you are not doing well or get worse.   This information is not intended to replace advice given to you by your health care provider. Make sure you discuss any questions you have with your health care provider.   Document Released: 06/13/2005 Document Revised: 04/03/2013 Document Reviewed: 01/23/2013 Elsevier Interactive Patient Education [employer].

## 2015-05-22 NOTE — Progress Notes (Signed)
Patient ID: Edward Erickson, male    DOB: 05-22-1966  Age: 49 y.o. MRN: [id number]  Chief Complaint  Patient presents with  . other    Meds refill  Norvasc and Sertraline  . Depression    see screening    Subjective:   Patient is here for a routine follow-up. He has been out of the sertraline but got 3 pills for the pharmacy. He has been doing fairly well. Takes his blood pressure medicines faithfully. Continues to take the potassium still. He has felt okay except for when he stands a while he has pain behind his left calf. It hurts him to go up and down stairs. He has had no injury. No lower leg pain below that. No swelling below that. Denies chest pain. Gets some shortness of breath sometimes when he talks too much he says.  Current allergies, medications, problem list, past/family and social histories reviewed.  Objective:  BP 132/80 mmHg  Pulse 66  Temp(Src) 98.2 F (36.8 C) (Oral)  Resp 18  Ht 5\' 6"  (1.676 m)  Wt 216 lb (97.977 kg)  BMI 34.88 kg/m2  SpO2 98%  No acute distress. Neck supple without nodes or thyromegaly. Chest clear. Heart regular without murmur. Abdomen soft without masses or tenderness. Extremities without edema. Tender in the left popliteal fossa, possible Baker's cyst. It is not very large.  Assessment & Plan:   Assessment: 1. Essential hypertension   2. Nasal congestion   3. Generalized anxiety disorder   4. Erectile dysfunction, unspecified erectile dysfunction type   5. Hyperlipidemia   6. Baker's cyst of knee, left   7. Knee pain, acute, left       Plan: Get an ultrasound scheduled for his leg. Diclofenac for pain and inflammation and knee  Return in 6 months Continue current medications otherwise. He is already had his flu shot.  Orders Placed This Encounter  Procedures  . [place] Misc Soft Tissue    Standing Status: Future     Number of Occurrences:      Standing Expiration Date: 07/21/2016    Order Specific Question:  Reason for  Exam (SYMPTOM  OR DIAGNOSIS REQUIRED)    Answer:  pain left popliteal fossa, suspicious for baker's cyst left knee    Order Specific Question:  Preferred imaging location?    Answer:  External  . Basic metabolic panel  . Lipid panel    Meds ordered this encounter  Medications  . amLODipine (NORVASC) 10 MG tablet    Sig: Take 1 tablet (10 mg total) by mouth daily.    Dispense:  90 tablet    Refill:  3  . lisinopril-hydrochlorothiazide (PRINZIDE,ZESTORETIC) 20-12.5 MG tablet    Sig: Take 1 tablet by mouth daily.    Dispense:  90 tablet    Refill:  3  . fluticasone (FLONASE) 50 MCG/ACT nasal spray    Sig: Place 1 spray into both nostrils at bedtime.    Dispense:  16 g    Refill:  5  . metoprolol (LOPRESSOR) 50 MG tablet    Sig: Take 1 tablet (50 mg total) by mouth 2 (two) times daily.    Dispense:  180 tablet    Refill:  3  . sertraline (ZOLOFT) 50 MG tablet    Sig: Take 1 tablet (50 mg total) by mouth daily.    Dispense:  30 tablet    Refill:  5  . potassium chloride SA (K-DUR,KLOR-CON) 20 MEQ tablet    Sig: TAKE  1 TABLET BY MOUTH EVERY DAY  "NO MORE REFILLS WITHOUT OFFICE VISIT"    Dispense:  90 tablet    Refill:  1  . diclofenac (VOLTAREN) 75 MG EC tablet    Sig: Take 1 tablet (75 mg total) by mouth 2 (two) times daily.    Dispense:  30 tablet    Refill:  0         Patient Instructions  Return 6 months  Continue current medications  Take diclofenac one twice daily for the pain and inflammation in the knee. If symptoms are not improved please return.  You're being scheduled for an ultrasound of your knee. You should hear from our office next week. If for any reason you do not hear from them please call and speak to the referral's.  Baker Cyst A Baker cyst is a sac-like structure that forms in the back of the knee. It is filled with the same fluid that is located in your knee. This fluid lubricates the bones and cartilage of the knee and allows them to move over  each other more easily. CAUSES  When the knee becomes injured or inflamed, increased fluid forms in the knee. When this happens, the joint lining is pushed out behind the knee and forms the Baker cyst. This cyst may also be caused by inflammation from arthritic conditions and infections. SIGNS AND SYMPTOMS  A Baker cyst usually has no symptoms. When the cyst is substantially enlarged:  You may feel pressure behind the knee, stiffness in the knee, or a mass in the area behind the knee.  You may develop pain, redness, and swelling in the calf. This can suggest a blood clot and requires evaluation by your health care provider. DIAGNOSIS  A Baker cyst is most often found during an ultrasound exam. This exam may have been performed for other reasons, and the cyst was found incidentally. Sometimes an MRI is used. This picks up other problems within a joint that an ultrasound exam may not. If the Baker cyst developed immediately after an injury, X-ray exams may be used to diagnose the cyst. TREATMENT  The treatment depends on the cause of the cyst. Anti-inflammatory medicines and rest often will be prescribed. If the cyst is caused by a bacterial infection, antibiotic medicines may be prescribed.  HOME CARE INSTRUCTIONS   If the cyst was caused by an injury, for the first 24 hours, keep the injured leg elevated on 2 pillows while lying down.  For the first 24 hours while you are awake, apply ice to the injured area:  Put ice in a plastic bag.  Place a towel between your skin and the bag.  Leave the ice on for 20 minutes, 2-3 times a day.  Only take over-the-counter or prescription medicines for pain, discomfort, or fever as directed by your health care provider.  Only take antibiotic medicine as directed. Make sure to finish it even if you start to feel better. MAKE SURE YOU:   Understand these instructions.  Will watch your condition.  Will get help right away if you are not doing well  or get worse.   This information is not intended to replace advice given to you by your health care provider. Make sure you discuss any questions you have with your health care provider.   Document Released: 06/13/2005 Document Revised: 04/03/2013 Document Reviewed: 01/23/2013 Elsevier Interactive Patient Education [employer].     Return in about 6 months (around 11/19/2015).   Maddux First,  MD 05/22/2015

## 2015-05-23 LAB — LIPID PANEL
CHOL/HDL RATIO: 5.2 ratio — AB (ref <=5.0)
Cholesterol: 234 mg/dL — ABNORMAL HIGH (ref 125–200)
HDL: 45 mg/dL (ref >=40)
LDL CALC: 149 mg/dL — AB (ref <130)
Triglycerides: 199 mg/dL — ABNORMAL HIGH (ref <150)
VLDL: 40 mg/dL — ABNORMAL HIGH (ref <30)

## 2015-05-23 LAB — BASIC METABOLIC PANEL
BUN: 16 mg/dL (ref 7–25)
CHLORIDE: 99 mmol/L (ref 98–110)
CO2: 31 mmol/L (ref 20–31)
Calcium: 9 mg/dL (ref 8.6–10.3)
Creat: 0.94 mg/dL (ref 0.60–1.35)
Glucose, Bld: 99 mg/dL (ref 65–99)
Potassium: 3.4 mmol/L — ABNORMAL LOW (ref 3.5–5.3)
SODIUM: 138 mmol/L (ref 135–146)

## 2015-05-25 ENCOUNTER — Telehealth: Payer: Self-pay | Admitting: Physician Assistant

## 2015-05-25 MED ORDER — ATORVASTATIN CALCIUM 20 MG PO TABS: 20.0000 mg | ORAL_TABLET | Freq: Every evening | ORAL | Status: DC

## 2015-05-25 NOTE — Telephone Encounter (Signed)
Patient was called and advised that his cholesterol was elevated and he needed to begin medication. However, when he went to the pharmacy today, there was no medication there.  Chart reviewed. Dr. Clayborn Heron request noted, appears it was not prescribed.  As per Dr. Clayborn Heron note: Meds ordered this encounter  Medications  . atorvastatin (LIPITOR) 20 MG tablet    Sig: Take 1 tablet (20 mg total) by mouth every evening. Take with evening meal.    Dispense:  30 tablet    Refill:  5    Order Specific Question:  Supervising Provider    Answer:  DOOLITTLE, ROBERT P [id number]

## 2015-05-29 ENCOUNTER — Other Ambulatory Visit: Payer: Self-pay | Admitting: Family Medicine

## 2015-05-29 ENCOUNTER — Ambulatory Visit
Admission: RE | Admit: 2015-05-29 | Discharge: 2015-05-29 | Disposition: A | Discharge status: Home / Self Care | Payer: 59 | Source: Ambulatory Visit | Attending: Family Medicine | Admitting: Family Medicine

## 2015-05-29 DIAGNOSIS — M25562 Pain in left knee: Secondary | ICD-10-CM

## 2015-05-29 DIAGNOSIS — M7122 Synovial cyst of popliteal space [Baker], left knee: Secondary | ICD-10-CM

## 2015-08-31 ENCOUNTER — Ambulatory Visit (INDEPENDENT_AMBULATORY_CARE_PROVIDER_SITE_OTHER): Payer: 59

## 2015-08-31 ENCOUNTER — Ambulatory Visit (INDEPENDENT_AMBULATORY_CARE_PROVIDER_SITE_OTHER): Payer: 59 | Admitting: Family Medicine

## 2015-08-31 VITALS — BP 116/68 | HR 63 | Temp 97.8°F | Resp 17 | Ht 66.0 in | Wt 218.0 lb

## 2015-08-31 DIAGNOSIS — M1711 Unilateral primary osteoarthritis, right knee: Secondary | ICD-10-CM

## 2015-08-31 DIAGNOSIS — K122 Cellulitis and abscess of mouth: Secondary | ICD-10-CM | POA: Diagnosis not present

## 2015-08-31 DIAGNOSIS — J029 Acute pharyngitis, unspecified: Secondary | ICD-10-CM | POA: Diagnosis not present

## 2015-08-31 DIAGNOSIS — M25561 Pain in right knee: Secondary | ICD-10-CM

## 2015-08-31 DIAGNOSIS — M179 Osteoarthritis of knee, unspecified: Secondary | ICD-10-CM

## 2015-08-31 DIAGNOSIS — M25562 Pain in left knee: Secondary | ICD-10-CM

## 2015-08-31 LAB — POCT CBC
GRANULOCYTE PERCENT: 52.8 % (ref 37–80)
HEMATOCRIT: 39.5 % — AB (ref 43.5–53.7)
Hemoglobin: 14 g/dL — AB (ref 14.1–18.1)
Lymph, poc: 2.5 (ref 0.6–3.4)
MCH, POC: 28.4 pg (ref 27–31.2)
MCHC: 35.4 g/dL (ref 31.8–35.4)
MCV: 80.4 fL (ref 80–97)
MID (cbc): 0.7 (ref 0–0.9)
MPV: 10 fL (ref 0–99.8)
POC GRANULOCYTE: 3.6 (ref 2–6.9)
POC LYMPH %: 36.8 % (ref 10–50)
POC MID %: 10.4 % (ref 0–12)
Platelet Count, POC: 154 10*3/uL (ref 142–424)
RBC: 4.91 M/uL (ref 4.69–6.13)
RDW, POC: 14.5 %
WBC: 6.8 10*3/uL (ref 4.6–10.2)

## 2015-08-31 LAB — POCT SEDIMENTATION RATE: POCT SED RATE: 31 mm/hr — AB (ref 0–22)

## 2015-08-31 LAB — POCT RAPID STREP A (OFFICE): RAPID STREP A SCREEN: NEGATIVE

## 2015-08-31 MED ORDER — LOSARTAN POTASSIUM-HCTZ 50-12.5 MG PO TABS: 1.0000 | ORAL_TABLET | Freq: Every day | ORAL | Status: DC

## 2015-08-31 MED ORDER — PREDNISONE 10 MG PO TABS: ORAL_TABLET | ORAL | Status: DC

## 2015-08-31 MED ORDER — AMOXICILLIN 500 MG PO CAPS: 500.0000 mg | ORAL_CAPSULE | Freq: Two times a day (BID) | ORAL | Status: DC

## 2015-08-31 NOTE — Patient Instructions (Addendum)
Stop taking the lisinopril-hctz blood pressure medication.  After 1 week, start instead on losartan-hctz. I think there is a small likelihood that this could potentially be an allergic reaction to the blood pressure medicine Do not restart the diclofenac until after you are done with the prednisone course and have restarted your losartan-hctz.    Because you received labwork today, you will receive an invoice from [employer]. Please contact [facility] at [phone] with questions or concerns regarding your invoice. Our billing staff will not be able to assist you with those questions.   Because you received an x-ray today, you will receive an invoice from [facility]. Please contact [facility] at [phone] with questions or concerns regarding your invoice. Our billing staff will not be able to assist you with those questions.  You will be contacted with the lab results as soon as they are available. The fastest way to get your results is to activate your My Chart account. Instructions are located on the last page of this paperwork. If you have not heard from [place] regarding the results in 2 weeks, please contact this office.  Uvulitis Uvulitis is infection or inflammation of the uvula. The uvula is the small, finger-like piece of tissue that hangs down at the back of your throat. CAUSES This condition may be caused by:  An infection in the mouth or throat. This is the most common cause.  Trauma to the uvula. Causes of trauma include burning your mouth and heavy snoring.  Fluid build-up (edema). Edema can be triggered be an allergic reaction. Uvulitis that is caused by edema is called Quincke disease.  Inhaling irritants, such as chemical agents, smoke, or steam. SYMPTOMS Symptoms of this condition depend on the cause.  Symptoms of uvulitis that is caused by infection include:  Red, swollen uvula.  Sore  throat.  Fever.  Headache.  Swollen neck glands. Symptoms of uvulitis that is caused by trauma, edema, or irritation include:  Red, swollen uvula.  Sore throat.  Trouble swallowing.  Choking or gagging.  Trouble breathing. DIAGNOSIS This condition is diagnosed with a physical exam. You also may have tests, such as a throat culture and blood tests. TREATMENT Treatment for this condition depends on the cause. Treatment may involve:  Antibiotic medicine. Antibiotics may be prescribed if a bacterial infection is the cause.  Steroid medicine. Steroids may be given if edema is the cause.  Surgery to remove part of the uvula (partial uvulectomy). HOME CARE INSTRUCTIONS  Rest as much as possible until your condition improves.  Drink enough fluid to keep your urine clear or pale yellow.  Take over-the-counter and prescription medicines only as told by your health care provider.  If you were prescribed an antibiotic medicine, take it as told by your health care provider. Do not stop taking the antibiotic even if you start to feel better.  Use a cool-mist humidifier to ease irritation in your throat.  While your throat is sore:  Eat soft foods or drink liquids, such as soup.  Gargle with a salt-water mixture 3-4 times per day or as needed. To make a salt-water mixture, completely dissolve -1 tsp of salt in 1 cup of warm water.  Keep all follow-up visits as told by your health care provider. This is important. SEEK MEDICAL CARE IF:  You have a fever.  You have trouble eating.  Your symptoms do not get better.  Your symptoms come back after treatment. SEEK IMMEDIATE MEDICAL CARE IF:  You have trouble breathing.  You have trouble swallowing.   This information is not intended to replace advice given to you by your health care provider. Make sure you discuss any questions you have with your health care provider.   Document Released: 01/22/2004 Document Revised:  03/04/2015 Document Reviewed: 09/03/2014 Elsevier Interactive Patient Education [employer].

## 2015-08-31 NOTE — Progress Notes (Signed)
Subjective:    Patient ID: Edward Erickson, male    DOB: January 09, 1966, 50 y.o.   MRN: [id number] Chief Complaint  Patient presents with  . feels like something is in his throat  . Depression    HPI  Has been feeling ill with URI sxs for a week. Last night he slept with his mouth open and did not use his cpap machine and he woke up with much worse pharyngitis and uvula was swollen. No f/c. No otc meds, is using the flonase.  He has been having right popliteal pain for a while -had [place] in Dec that was negative for baker's cyst.  Hurts most when he moves from sitting to standing and after standing for a while it becomes swollen and hard to bend knee.  He was given diclofenac which helped a lot. He does report his knee tends to give out on him and feels like he is going to fall. Sometimes locks up. Has not seen a specialist. Has not tried anyh compression or bracing  Past Medical History  Diagnosis Date  . Hypertension   . Asthma   . Apnea   . Snoring   . Hyperlipidemia     mild  . OSA on CPAP 07/30/2013   No past surgical history on file. Current Outpatient Prescriptions on File Prior to Visit  Medication Sig Dispense Refill  . amLODipine (NORVASC) 10 MG tablet Take 1 tablet (10 mg total) by mouth daily. 90 tablet 3  . atorvastatin (LIPITOR) 20 MG tablet Take 1 tablet (20 mg total) by mouth every evening. Take with evening meal. 30 tablet 5  . fluticasone (FLONASE) 50 MCG/ACT nasal spray Place 1 spray into both nostrils at bedtime. 16 g 5  . metoprolol (LOPRESSOR) 50 MG tablet Take 1 tablet (50 mg total) by mouth 2 (two) times daily. 180 tablet 3  . olopatadine (PATANOL) 0.1 % ophthalmic solution Place 1 drop into both eyes 2 (two) times daily. 5 mL 12  . potassium chloride SA (K-DUR,KLOR-CON) 20 MEQ tablet TAKE 1 TABLET BY MOUTH EVERY DAY  "NO MORE REFILLS WITHOUT OFFICE VISIT" 90 tablet 1  . sertraline (ZOLOFT) 50 MG tablet Take 1 tablet (50 mg total) by mouth daily. 30 tablet 5    No current facility-administered medications on file prior to visit.   No Known Allergies Family History  Problem Relation Age of Onset  . Hypertension Mother   . Hypertension Father   . Snoring Brother     all 3 brothers  . Snoring Sister     all 3 sisters   Social History   Social History  . Marital Status: Legally Separated    Spouse Name: N/A  . Number of Children: N/A  . Years of Education: N/A   Social History Main Topics  . Smoking status: Never Smoker   . Smokeless tobacco: None  . Alcohol Use: Yes  . Drug Use: No  . Sexual Activity: Not Asked   Other Topics Concern  . None   Social History Narrative     Review of Systems  Constitutional: Positive for fatigue. Negative for fever, chills, diaphoresis, activity change, appetite change and unexpected weight change.  HENT: Positive for congestion, postnasal drip, rhinorrhea, sore throat and trouble swallowing. Negative for ear pain, mouth sores and sinus pressure.   Respiratory: Positive for apnea and cough. Negative for shortness of breath.   Cardiovascular: Negative for chest pain.  Gastrointestinal: Negative for nausea, vomiting, abdominal pain, diarrhea and  constipation.  Genitourinary: Negative for dysuria.  Musculoskeletal: Positive for joint swelling, arthralgias and gait problem. Negative for myalgias.  Skin: Negative for rash.  Neurological: Negative for syncope.  Hematological: Negative for adenopathy.  Psychiatric/Behavioral: Positive for sleep disturbance.       Objective:  BP 116/68 mmHg  Pulse 63  Temp(Src) 97.8 F (36.6 C) (Oral)  Resp 17  Ht 5\' 6"  (1.676 m)  Wt 218 lb (98.884 kg)  BMI 35.20 kg/m2  SpO2 98%  Physical Exam  Constitutional: He is oriented to person, place, and time. He appears well-developed and well-nourished. No distress.  HENT:  Head: Normocephalic and atraumatic.  Right Ear: External ear and ear canal normal. Tympanic membrane is retracted. A middle ear effusion  is present.  Left Ear: External ear and ear canal normal. Tympanic membrane is retracted. A middle ear effusion is present.  Nose: Mucosal edema and rhinorrhea present. Right sinus exhibits no maxillary sinus tenderness. Left sinus exhibits no maxillary sinus tenderness.  Mouth/Throat: Uvula is midline and mucous membranes are normal. Posterior oropharyngeal edema and posterior oropharyngeal erythema present. No oropharyngeal exudate.  Tonsils 2-3+ - uvula is moderately swollen but tonsils are close but sev mm from kissing/abutting uvula  Eyes: Conjunctivae are normal. Right eye exhibits no discharge. Left eye exhibits no discharge. No scleral icterus.  Neck: Normal range of motion. Neck supple. No thyromegaly present.  Cardiovascular: Normal rate, regular rhythm, normal heart sounds and intact distal pulses.   Pulmonary/Chest: Effort normal and breath sounds normal. No respiratory distress.  Musculoskeletal:       Right knee: He exhibits decreased range of motion. He exhibits normal patellar mobility.  Mild crepitus bilateral knees. Pain with right knee with extremes of flex/ext as well as varus/valgus, minimall ttp on med and lat joint lines  Lymphadenopathy:       Head (right side): Submandibular adenopathy present. No preauricular and no posterior auricular adenopathy present.       Head (left side): Submandibular adenopathy present. No preauricular and no posterior auricular adenopathy present.    He has cervical adenopathy.       Right cervical: Superficial cervical adenopathy present. No posterior cervical adenopathy present.      Left cervical: Superficial cervical adenopathy present. No posterior cervical adenopathy present.       Right: No supraclavicular adenopathy present.       Left: No supraclavicular adenopathy present.  Neurological: He is alert and oriented to person, place, and time.  Skin: Skin is warm and dry. He is not diaphoretic. No erythema.  Psychiatric: He has a normal  mood and affect. His behavior is normal.   Results for orders placed or performed in visit on 08/31/15  POCT SEDIMENTATION RATE  Result Value Ref Range   POCT SED RATE 31 (A) 0 - 22 mm/hr  POCT CBC  Result Value Ref Range   WBC 6.8 4.6 - 10.2 K/uL   Lymph, poc 2.5 0.6 - 3.4   POC LYMPH PERCENT 36.8 10 - 50 %L   MID (cbc) 0.7 0 - 0.9   POC MID % 10.4 0 - 12 %M   POC Granulocyte 3.6 2 - 6.9   Granulocyte percent 52.8 37 - 80 %G   RBC 4.91 4.69 - 6.13 M/uL   Hemoglobin 14.0 (A) 14.1 - 18.1 g/dL   HCT, POC 39.5 (A) 43.5 - 53.7 %   MCV 80.4 80 - 97 fL   MCH, POC 28.4 27 - 31.2 pg   MCHC  35.4 31.8 - 35.4 g/dL   RDW, POC 14.5 %   Platelet Count, POC 154 142 - 424 K/uL   MPV 10.0 0 - 99.8 fL  POCT rapid strep A  Result Value Ref Range   Rapid Strep A Screen Negative Negative   Dg Knee 1-2 Views Right  08/31/2015  CLINICAL DATA:  Posterior knee pain with instability and occasional locking. No acute injury. EXAM: RIGHT KNEE - 1-2 VIEW COMPARISON:  None. FINDINGS: The mineralization and alignment are normal. There is no evidence of acute fracture or dislocation. The joint spaces maintained. There is patellar spurring. No significant joint effusion is identified. IMPRESSION: No acute osseous findings. Electronically Signed   By: Richardean Sale M.D.   On: 08/31/2015 11:44      Assessment & Plan:   1. Acute pharyngitis, unspecified etiology - with nml wbc and neg strep I am concerned this could be angioedema isolated to oropharynx - stop lisinopril-hctz, rtc immed if sxs cont. Cover with pred taper and amox. After 1 wk when sxs resolve, start losartan-hctz instead but cautioned of poss recurrent angioedema on this as well. Recheck HTN in 2-3 mos.  2. Uvulitis   3. Posterior right knee pain - pred taper should help. Responded quite well to diclofenac prior to ok to resume after pred taper but watch BP - may want to consider trial of intra-articular cortisone to help to minimize nsaid use. Placed  in webbed reaction brace for comfort.  4. Osteoarthritis of right knee, unspecified osteoarthritis type     Orders Placed This Encounter  Procedures  . Culture, Group A Strep    Order Specific Question:  Source    Answer:  throat  . DG Knee 1-2 Views Right    Standing Status: Future     Number of Occurrences: 1     Standing Expiration Date: 08/30/2016    Order Specific Question:  Reason for Exam (SYMPTOM  OR DIAGNOSIS REQUIRED)    Answer:  diffuse pain worse in popliteal, suspect arthritis, please get weightbearing films    Order Specific Question:  Preferred imaging location?    Answer:  External  . POCT SEDIMENTATION RATE  . POCT CBC  . POCT rapid strep A    Meds ordered this encounter  Medications  . predniSONE (DELTASONE) 10 MG tablet    Sig: 6-5-4-3-2-1 tabs po qd    Dispense:  21 tablet    Refill:  0  . amoxicillin (AMOXIL) 500 MG capsule    Sig: Take 1 capsule (500 mg total) by mouth 2 (two) times daily.    Dispense:  20 capsule    Refill:  0  . losartan-hydrochlorothiazide (HYZAAR) 50-12.5 MG tablet    Sig: Take 1 tablet by mouth daily.    Dispense:  90 tablet    Refill:  0    Delman Cheadle, MD MPH

## 2015-09-01 MED ORDER — DICLOFENAC SODIUM 75 MG PO TBEC: 75.0000 mg | DELAYED_RELEASE_TABLET | Freq: Two times a day (BID) | ORAL | Status: DC

## 2015-09-03 LAB — CULTURE, GROUP A STREP: ORGANISM ID, BACTERIA: NORMAL

## 2015-09-06 ENCOUNTER — Other Ambulatory Visit: Payer: Self-pay | Admitting: Family Medicine

## 2015-09-11 ENCOUNTER — Other Ambulatory Visit: Payer: Self-pay | Admitting: Family Medicine

## 2015-11-22 ENCOUNTER — Other Ambulatory Visit: Payer: Self-pay | Admitting: Family Medicine

## 2015-11-29 ENCOUNTER — Other Ambulatory Visit: Payer: Self-pay | Admitting: Physician Assistant

## 2015-11-29 ENCOUNTER — Other Ambulatory Visit: Payer: Self-pay | Admitting: Family Medicine

## 2015-12-30 ENCOUNTER — Other Ambulatory Visit: Payer: Self-pay | Admitting: Family Medicine

## 2016-01-01 NOTE — Telephone Encounter (Signed)
Dr Brigitte Pulse, you saw pt in March, and depression is listed on pt's "reason for visit" list, but I don't see Dx code for it on visit, or notes regarding depresson in OV notes. Does pt need to RTC for f/up on depression, or do you want to authorize a RF?

## 2016-01-05 NOTE — Telephone Encounter (Signed)
Pt is long overdue for a follow-up on his depression and his HTN. He does not have appt sched. He was told he needed OV when last given a 30d refill 6 wks ago.  Refilled once more since he really should not come off this medicine but he needs to come in to follow-up on his chronic medical problems prior to running out of his medications.  Please call him and tell his this.

## 2016-02-12 ENCOUNTER — Other Ambulatory Visit: Payer: Self-pay | Admitting: Family Medicine

## 2016-02-14 ENCOUNTER — Other Ambulatory Visit: Payer: Self-pay | Admitting: Family Medicine

## 2016-02-14 DIAGNOSIS — N529 Male erectile dysfunction, unspecified: Secondary | ICD-10-CM

## 2016-02-14 DIAGNOSIS — F411 Generalized anxiety disorder: Secondary | ICD-10-CM

## 2016-02-15 NOTE — Telephone Encounter (Signed)
Called pt to advise he is overdue for anxiety f/up and needs to come in for RF. Pt agreed to come in on Wed, but he has been out of medication and would like me to send in 1 more month because he will have same co-pay if I send in 2 days or a mos. Pt promises to RTC Wed. Explained same day appts and instru'd pt to call Tues after 4pm or Wed morning for appt for Wed.

## 2016-02-26 ENCOUNTER — Other Ambulatory Visit: Payer: Self-pay | Admitting: Family Medicine

## 2016-03-01 ENCOUNTER — Ambulatory Visit (INDEPENDENT_AMBULATORY_CARE_PROVIDER_SITE_OTHER): Payer: 59 | Admitting: Family Medicine

## 2016-03-01 VITALS — BP 138/70 | HR 68 | Temp 98.0°F | Resp 16 | Ht 66.0 in | Wt 218.0 lb

## 2016-03-01 DIAGNOSIS — I10 Essential (primary) hypertension: Secondary | ICD-10-CM

## 2016-03-01 DIAGNOSIS — F411 Generalized anxiety disorder: Secondary | ICD-10-CM | POA: Diagnosis not present

## 2016-03-01 DIAGNOSIS — E785 Hyperlipidemia, unspecified: Secondary | ICD-10-CM

## 2016-03-01 DIAGNOSIS — M25569 Pain in unspecified knee: Secondary | ICD-10-CM | POA: Diagnosis not present

## 2016-03-01 LAB — CBC WITH DIFFERENTIAL/PLATELET
BASOS ABS: 0 {cells}/uL (ref 0–200)
Basophils Relative: 0 %
EOS ABS: 0 {cells}/uL — AB (ref 15–500)
EOS PCT: 0 %
HEMATOCRIT: 41.7 % (ref 38.5–50.0)
HEMOGLOBIN: 14.1 g/dL (ref 13.2–17.1)
LYMPHS ABS: 1768 {cells}/uL (ref 850–3900)
Lymphocytes Relative: 34 %
MCH: 26.9 pg — AB (ref 27.0–33.0)
MCHC: 33.8 g/dL (ref 32.0–36.0)
MCV: 79.6 fL — AB (ref 80.0–100.0)
MONO ABS: 676 {cells}/uL (ref 200–950)
Monocytes Relative: 13 %
NEUTROS PCT: 53 %
Neutro Abs: 2756 cells/uL (ref 1500–7800)
Platelets: 168 10*3/uL (ref 140–400)
RBC: 5.24 MIL/uL (ref 4.20–5.80)
RDW: 14.5 % (ref 11.0–15.0)
WBC: 5.2 10*3/uL (ref 3.8–10.8)

## 2016-03-01 LAB — COMPLETE METABOLIC PANEL WITH GFR
ALT: 22 U/L (ref 9–46)
AST: 23 U/L (ref 10–40)
Albumin: 4 g/dL (ref 3.6–5.1)
Alkaline Phosphatase: 91 U/L (ref 40–115)
BILIRUBIN TOTAL: 0.5 mg/dL (ref 0.2–1.2)
BUN: 13 mg/dL (ref 7–25)
CHLORIDE: 101 mmol/L (ref 98–110)
CO2: 30 mmol/L (ref 20–31)
Calcium: 8.9 mg/dL (ref 8.6–10.3)
Creat: 0.85 mg/dL (ref 0.60–1.35)
GFR, Est African American: >89 mL/min (ref >=60)
GLUCOSE: 112 mg/dL — AB (ref 65–99)
Potassium: 3.6 mmol/L (ref 3.5–5.3)
SODIUM: 139 mmol/L (ref 135–146)
TOTAL PROTEIN: 7.8 g/dL (ref 6.1–8.1)

## 2016-03-01 LAB — LIPID PANEL
CHOL/HDL RATIO: 3.1 ratio (ref <=5.0)
CHOLESTEROL: 187 mg/dL (ref 125–200)
HDL: 61 mg/dL (ref >=40)
LDL Cholesterol: 105 mg/dL (ref <130)
TRIGLYCERIDES: 103 mg/dL (ref <150)
VLDL: 21 mg/dL (ref <30)

## 2016-03-01 MED ORDER — SERTRALINE HCL 50 MG PO TABS: ORAL_TABLET | ORAL | 3 refills | Status: DC

## 2016-03-01 MED ORDER — DICLOFENAC SODIUM 75 MG PO TBEC: 75.0000 mg | DELAYED_RELEASE_TABLET | Freq: Two times a day (BID) | ORAL | 2 refills | Status: DC

## 2016-03-01 MED ORDER — LOSARTAN POTASSIUM-HCTZ 50-12.5 MG PO TABS: 1.0000 | ORAL_TABLET | Freq: Every day | ORAL | 1 refills | Status: DC

## 2016-03-01 MED ORDER — METOPROLOL TARTRATE 50 MG PO TABS: 50.0000 mg | ORAL_TABLET | Freq: Two times a day (BID) | ORAL | 3 refills | Status: DC

## 2016-03-01 MED ORDER — ATORVASTATIN CALCIUM 20 MG PO TABS: ORAL_TABLET | ORAL | 1 refills | Status: DC

## 2016-03-01 NOTE — Patient Instructions (Addendum)
I will follow-up regarding your lab results. Return in 6 months for hypertension follow-up. Sports medicine will contact you to schedule your appointment,  IF you received an x-ray today, you will receive an invoice from [facility]. Please contact [facility] at [phone] with questions or concerns regarding your invoice.   IF you received labwork today, you will receive an invoice from [employer]. Please contact [facility] at [phone] with questions or concerns regarding your invoice.   Our billing staff will not be able to assist you with questions regarding bills from these companies.  You will be contacted with the lab results as soon as they are available. The fastest way to get your results is to activate your My Chart account. Instructions are located on the last page of this paperwork. If you have not heard from [place] regarding the results in 2 weeks, please contact this office.     hyper Hypertension Hypertension, commonly called high blood pressure, is when the force of blood pumping through your arteries is too strong. Your arteries are the blood vessels that carry blood from your heart throughout your body. A blood pressure reading consists of a higher number over a lower number, such as 110/72. The higher number (systolic) is the pressure inside your arteries when your heart pumps. The lower number (diastolic) is the pressure inside your arteries when your heart relaxes. Ideally you want your blood pressure below 120/80. Hypertension forces your heart to work harder to pump blood. Your arteries may become narrow or stiff. Having untreated or uncontrolled hypertension can cause heart attack, stroke, kidney disease, and other problems. RISK FACTORS Some risk factors for high blood pressure are controllable. Others are not.  Risk factors you cannot control include:   Race. You may be at higher risk if you are African American.  Age.  Risk increases with age.  Gender. Men are at higher risk than women before age 98 years. After age 106, women are at higher risk than men. Risk factors you can control include:  Not getting enough exercise or physical activity.  Being overweight.  Getting too much fat, sugar, calories, or salt in your diet.  Drinking too much alcohol. SIGNS AND SYMPTOMS Hypertension does not usually cause signs or symptoms. Extremely high blood pressure (hypertensive crisis) may cause headache, anxiety, shortness of breath, and nosebleed. DIAGNOSIS To check if you have hypertension, your health care provider will measure your blood pressure while you are seated, with your arm held at the level of your heart. It should be measured at least twice using the same arm. Certain conditions can cause a difference in blood pressure between your right and left arms. A blood pressure reading that is higher than normal on one occasion does not mean that you need treatment. If it is not clear whether you have high blood pressure, you may be asked to return on a different day to have your blood pressure checked again. Or, you may be asked to monitor your blood pressure at home for 1 or more weeks. TREATMENT Treating high blood pressure includes making lifestyle changes and possibly taking medicine. Living a healthy lifestyle can help lower high blood pressure. You may need to change some of your habits. Lifestyle changes may include:  Following the DASH diet. This diet is high in fruits, vegetables, and whole grains. It is low in salt, red meat, and added sugars.  Keep your sodium intake below 2,300 mg per day.  Getting at least 30-45  minutes of aerobic exercise at least 4 times per week.  Losing weight if necessary.  Not smoking.  Limiting alcoholic beverages.  Learning ways to reduce stress. Your health care provider may prescribe medicine if lifestyle changes are not enough to get your blood pressure under  control, and if one of the following is true:  You are 44-65 years of age and your systolic blood pressure is above 140.  You are 5 years of age or older, and your systolic blood pressure is above 150.  Your diastolic blood pressure is above 90.  You have diabetes, and your systolic blood pressure is over [id number] or your diastolic blood pressure is over 90.  You have kidney disease and your blood pressure is above 140/90.  You have heart disease and your blood pressure is above 140/90. Your personal target blood pressure may vary depending on your medical conditions, your age, and other factors. HOME CARE INSTRUCTIONS  Have your blood pressure rechecked as directed by your health care provider.   Take medicines only as directed by your health care provider. Follow the directions carefully. Blood pressure medicines must be taken as prescribed. The medicine does not work as well when you skip doses. Skipping doses also puts you at risk for problems.  Do not smoke.   Monitor your blood pressure at home as directed by your health care provider. SEEK MEDICAL CARE IF:   You think you are having a reaction to medicines taken.  You have recurrent headaches or feel dizzy.  You have swelling in your ankles.  You have trouble with your vision. SEEK IMMEDIATE MEDICAL CARE IF:  You develop a severe headache or confusion.  You have unusual weakness, numbness, or feel faint.  You have severe chest or abdominal pain.  You vomit repeatedly.  You have trouble breathing. MAKE SURE YOU:   Understand these instructions.  Will watch your condition.  Will get help right away if you are not doing well or get worse.   This information is not intended to replace advice given to you by your health care provider. Make sure you discuss any questions you have with your health care provider.   Document Released: 06/13/2005 Document Revised: 10/28/2014 Document Reviewed: 04/05/2013 Elsevier  Interactive Patient Education [employer].

## 2016-03-01 NOTE — Progress Notes (Signed)
Patient ID: Edward Erickson, male    DOB: 1965/07/10, 50 y.o.   MRN: [id number]  PCP: No primary care provider on file.  Chief Complaint  Patient presents with  . Leg Pain    both    Subjective:   HPI 50 year old African-American male presents for evaluation of bilateral knee pain and medication refills. He has a chronic history of essential hypertension, generalized anxiety disorder, and hyperlipidemia.  1.Knee Pain Patient reports recurrent knee pain since November 2016.  He did receive an ultrasound of left knee  which was negative for Baker's cyst.  In March 2017 he had a right  knee x-ray which showed nothing acute , indicated mild patellar spurring.  He reports currently his knee pain averages about a 10 out of 10. Morbid pain with prolonged standing and prolonged walking. He reports the sensation of knee instability occasionally. Reports occasionally wearing a knee brace while working just to provide stability and to relieve knee pain. Reports the medication helped him the most was the diclofenac request a refill on that medication. I already evaluated vascular doctor and he was clear and noted that the source of his leg pain was not vascular.   2. Essential Hypertension Patient reports regularly taking his blood pressure medication, metoprolol  and losartan-hydrochlorothiazide. He does not routinely check his blood pressure at home. Denies chest pain, shortness of breath, headache, dizziness, or palpitations.  He also denies any lower extremity swelling.    3. General Anxiety Disorder Patient reports overall control of anxiety symptoms with sertraline 50 mg. He denies any panic attacks or recent feelings of anxiousness.    4. Hyperlipidemia  Patient reports that he takes Lipitor daily. He is currently unable to exercise as much as he would like due to his chronic knee pain. Reports no changes in diet or weight loss.  . Social History   Social History  . Marital  status: Legally Separated    Spouse name: N/A  . Number of children: N/A  . Years of education: N/A   Occupational History  . Not on file.   Social History Main Topics  . Smoking status: Never Smoker  . Smokeless tobacco: Not on file  . Alcohol use Yes  . Drug use: No  . Sexual activity: Not on file   Other Topics Concern  . Not on file   Social History Narrative  . No narrative on file    . Family History  Problem Relation Age of Onset  . Hypertension Mother   . Hypertension Father   . Snoring Brother     all 3 brothers  . Snoring Sister     all 3 sisters   Review of Systems  Constitutional: Negative.   Respiratory: Negative.   Cardiovascular: Negative.   Musculoskeletal:       Chronic knee pain see history of present illness  Neurological: Negative.   Psychiatric/Behavioral: Negative.     Patient Active Problem List   Diagnosis Date Noted  . OSA on CPAP 07/30/2013     Prior to Admission medications   Medication Sig Start Date End Date Taking? Authorizing Provider  amLODipine (NORVASC) 10 MG tablet Take 1 tablet (10 mg total) by mouth daily. 05/22/15  Yes Posey Boyer, MD  amoxicillin (AMOXIL) 500 MG capsule Take 1 capsule (500 mg total) by mouth 2 (two) times daily. 08/31/15  Yes Shawnee Knapp, MD  atorvastatin (LIPITOR) 20 MG tablet TAKE 1 TABLET(20 MG) BY MOUTH EVERY EVENING WITH  DINNER 11/30/15  Yes Shawnee Knapp, MD  diclofenac (VOLTAREN) 75 MG EC tablet Take 1 tablet (75 mg total) by mouth 2 (two) times daily. 09/01/15  Yes Shawnee Knapp, MD  fluticasone (FLONASE) 50 MCG/ACT nasal spray Place 1 spray into both nostrils at bedtime. 05/22/15  Yes Posey Boyer, MD  losartan-hydrochlorothiazide [facility]) 50-12.5 MG tablet TAKE 1 TABLET BY MOUTH DAILY 02/26/16  Yes Shawnee Knapp, MD  metoprolol (LOPRESSOR) 50 MG tablet Take 1 tablet (50 mg total) by mouth 2 (two) times daily. 05/22/15  Yes Posey Boyer, MD  olopatadine (PATANOL) 0.1 % ophthalmic solution Place 1 drop into  both eyes 2 (two) times daily. 08/30/14  Yes Posey Boyer, MD  potassium chloride SA (K-DUR,KLOR-CON) 20 MEQ tablet TAKE 1 TABLET BY MOUTH EVERY DAY 11/24/15  Yes Chelle Jeffery, PA-C  predniSONE (DELTASONE) 10 MG tablet 6-5-4-3-2-1 tabs po qd 08/31/15  Yes Shawnee Knapp, MD  sertraline (ZOLOFT) 50 MG tablet TAKE 1 TABLET(50 MG) BY MOUTH DAILY 11/24/15  Yes Chelle Jeffery, PA-C  sertraline (ZOLOFT) 50 MG tablet Take 1 tablet (50 mg total) by mouth daily. NEEDS OFFICE VISIT FOR REFILL 01/05/16  Yes Shawnee Knapp, MD  sertraline (ZOLOFT) 50 MG tablet TAKE 1 TABLET(50 MG) BY MOUTH DAILY 02/15/16  Yes Shawnee Knapp, MD     No Known Allergies     Objective:  Physical Exam  Constitutional: He is oriented to person, place, and time. He appears well-developed and well-nourished.  Cardiovascular: Normal rate, regular rhythm, normal heart sounds and intact distal pulses.   Pulmonary/Chest: Effort normal and breath sounds normal.  Musculoskeletal: He exhibits tenderness. He exhibits no edema.  Increased tenderness with extension of legs bilaterally. Reported decrease in tenderness with flexion. Knees bilaterally are negative of crepitus, negative effusion, and negative of tenderness with palpation.   Neurological: He is alert and oriented to person, place, and time.  Skin: Skin is warm and dry.  Psychiatric: He has a normal mood and affect. His behavior is normal. Judgment and thought content normal.     Vitals:   03/01/16 0832  BP: 138/70  Pulse: 68  Resp: 16  Temp: 98 F (36.7 C)   Assessment & Plan:  1. Essential hypertension, stable Plan:  Continue current regimen.  Check blood pressure periodically and bring readings with you to follow-up visit. - metoprolol (LOPRESSOR) 50 MG tablet; Take 1 tablet (50 mg total) by mouth 2 (two) times daily.  Dispense: 180 tablet; Refill: 3 - COMPLETE METABOLIC PANEL WITH GFR-unremarkable    2. Knee pain, acute, unspecified laterally, this has been a chronic  issue for over 10 months. Patient is obtaining relief from diclofenac although pain returns upon completion of medication. Plan: - Resume Diclofenac (VOLTAREN) 75 MG EC tablet; Take 1 tablet (75 mg total) by mouth 2 (two) times daily.   - Ambulatory referral to Sports Medicine for further evaluation.  3. Generalized anxiety disorder, well appearing and appropriate demeanor during visit. Plan:  - Continue sertraline (ZOLOFT) 50 MG tablet; TAKE 1 TABLET(50 MG) BY MOUTH DAILY   - CBC with Differential  4. Hyperlipidemia, Stable and controlled. Plan: -- Lipid panel-unremarkable _Continue Atorvastatin 20 mg daily.  Return in 6 months for hypertension reevaluation.  Carroll Sage. Kenton Kingfisher, MSN, FNP-C [facility]

## 2016-03-03 ENCOUNTER — Encounter: Payer: Self-pay | Admitting: Family Medicine

## 2016-03-03 NOTE — Progress Notes (Signed)
March 03, 2016   [place] [address] [place] [address]   Dear Mr. Burkhead,  Below are the results from your recent visit.  Your lab results were as expected.  Resulted Orders  COMPLETE METABOLIC PANEL WITH GFR  Result Value Ref Range   Sodium 139 135 - 146 mmol/L   Potassium 3.6 3.5 - 5.3 mmol/L   Chloride 101 98 - 110 mmol/L   CO2 30 20 - 31 mmol/L   Glucose, Bld 112 (H) 65 - 99 mg/dL   BUN 13 7 - 25 mg/dL   Creat 0.85 0.60 - 1.35 mg/dL   Total Bilirubin 0.5 0.2 - 1.2 mg/dL   Alkaline Phosphatase 91 40 - 115 U/L   AST 23 10 - 40 U/L   ALT 22 9 - 46 U/L   Total Protein 7.8 6.1 - 8.1 g/dL   Albumin 4.0 3.6 - 5.1 g/dL   Calcium 8.9 8.6 - 10.3 mg/dL   GFR, Est African American >89 >=60 mL/min   GFR, Est Non African American >89 >=60 mL/min   Narrative   Performed at:  [address], [address]                [place], [place] [address]  CBC with Differential  Result Value Ref Range   WBC 5.2 3.8 - 10.8 K/uL   RBC 5.24 4.20 - 5.80 MIL/uL   Hemoglobin 14.1 13.2 - 17.1 g/dL   HCT 41.7 38.5 - 50.0 %   MCV 79.6 (L) 80.0 - 100.0 fL   MCH 26.9 (L) 27.0 - 33.0 pg   MCHC 33.8 32.0 - 36.0 g/dL   RDW 14.5 11.0 - 15.0 %   Platelets 168 140 - 400 K/uL   Neutro Abs 2,756 1,500 - 7,800 cells/uL   Lymphs Abs 1,768 850 - 3,900 cells/uL   Monocytes Absolute 676 200 - 950 cells/uL   Eosinophils Absolute 0 (L) 15 - 500 cells/uL   Basophils Absolute 0 0 - 200 cells/uL   Neutrophils Relative % 53 %   Lymphocytes Relative 34 %   Monocytes Relative 13 %   Eosinophils Relative 0 %   Basophils Relative 0 %   Smear Review Criteria for review not met    Narrative   Performed at:  [address], [address]                [place], [place] [address]  Lipid panel  Result Value Ref Range   Cholesterol 187 125 - 200 mg/dL   Triglycerides 103 <150 mg/dL   HDL 61 >=40 mg/dL   Total CHOL/HDL Ratio  3.1 <=5.0 Ratio   VLDL 21 <30 mg/dL   LDL Cholesterol 105 <130 mg/dL     Comment:       Total Cholesterol/HDL Ratio:CHD Risk                        Coronary Heart Disease Risk Table                                        Men       Women  1/2 Average Risk              3.4        3.3              Average Risk              5.0        4.4           2X Average Risk              9.6        7.1           3X Average Risk             23.4       11.0 Use the calculated Patient Ratio above and the CHD Risk table  to determine the patient's CHD Risk.    Narrative   Performed at:  [address]                [place], [place] [address]     If you have any questions or concerns, please don't hesitate to call.  Sincerely,   Carroll Sage. Kenton Kingfisher, MSN, FNP-C [facility]

## 2016-06-07 ENCOUNTER — Other Ambulatory Visit: Payer: Self-pay | Admitting: Family Medicine

## 2016-06-07 DIAGNOSIS — I10 Essential (primary) hypertension: Secondary | ICD-10-CM

## 2016-06-13 ENCOUNTER — Other Ambulatory Visit: Payer: Self-pay | Admitting: Emergency Medicine

## 2016-06-13 DIAGNOSIS — I10 Essential (primary) hypertension: Secondary | ICD-10-CM

## 2016-06-13 MED ORDER — AMLODIPINE BESYLATE 10 MG PO TABS: 10.0000 mg | ORAL_TABLET | Freq: Every day | ORAL | 3 refills | Status: DC

## 2016-07-06 ENCOUNTER — Other Ambulatory Visit: Payer: Self-pay | Admitting: Family Medicine

## 2016-07-06 DIAGNOSIS — M25569 Pain in unspecified knee: Secondary | ICD-10-CM

## 2016-07-23 ENCOUNTER — Other Ambulatory Visit: Payer: Self-pay | Admitting: Family Medicine

## 2016-07-23 DIAGNOSIS — M25569 Pain in unspecified knee: Secondary | ICD-10-CM

## 2016-07-23 NOTE — Telephone Encounter (Signed)
02/2016 last ov 07/09/16 given 30 pills (15 days worth)

## 2016-08-20 ENCOUNTER — Other Ambulatory Visit: Payer: Self-pay | Admitting: Family Medicine

## 2016-08-20 DIAGNOSIS — F411 Generalized anxiety disorder: Secondary | ICD-10-CM

## 2016-10-01 ENCOUNTER — Other Ambulatory Visit: Payer: Self-pay | Admitting: Family Medicine

## 2016-10-01 DIAGNOSIS — F411 Generalized anxiety disorder: Secondary | ICD-10-CM

## 2016-11-21 ENCOUNTER — Other Ambulatory Visit: Payer: Self-pay | Admitting: Physician Assistant

## 2016-11-21 DIAGNOSIS — F411 Generalized anxiety disorder: Secondary | ICD-10-CM

## 2016-12-20 ENCOUNTER — Other Ambulatory Visit: Payer: Self-pay | Admitting: Family Medicine

## 2017-01-09 ENCOUNTER — Ambulatory Visit (INDEPENDENT_AMBULATORY_CARE_PROVIDER_SITE_OTHER): Payer: 59 | Admitting: Physician Assistant

## 2017-01-09 ENCOUNTER — Encounter: Payer: Self-pay | Admitting: Physician Assistant

## 2017-01-09 VITALS — BP 131/84 | HR 55 | Temp 97.5°F | Resp 18 | Ht 66.0 in | Wt 214.0 lb

## 2017-01-09 DIAGNOSIS — F411 Generalized anxiety disorder: Secondary | ICD-10-CM | POA: Diagnosis not present

## 2017-01-09 DIAGNOSIS — K122 Cellulitis and abscess of mouth: Secondary | ICD-10-CM

## 2017-01-09 DIAGNOSIS — R490 Dysphonia: Secondary | ICD-10-CM | POA: Diagnosis not present

## 2017-01-09 DIAGNOSIS — I10 Essential (primary) hypertension: Secondary | ICD-10-CM | POA: Diagnosis not present

## 2017-01-09 LAB — POCT RAPID STREP A (OFFICE): Rapid Strep A Screen: NEGATIVE

## 2017-01-09 MED ORDER — METOPROLOL TARTRATE 50 MG PO TABS: 50.0000 mg | ORAL_TABLET | Freq: Two times a day (BID) | ORAL | 1 refills | Status: DC

## 2017-01-09 MED ORDER — AMOXICILLIN-POT CLAVULANATE 875-125 MG PO TABS: 1.0000 | ORAL_TABLET | Freq: Two times a day (BID) | ORAL | 0 refills | Status: AC

## 2017-01-09 MED ORDER — POTASSIUM CHLORIDE CRYS ER 20 MEQ PO TBCR: 20.0000 meq | EXTENDED_RELEASE_TABLET | Freq: Every day | ORAL | 1 refills | Status: DC

## 2017-01-09 MED ORDER — AMLODIPINE BESYLATE 10 MG PO TABS
10.0000 mg | ORAL_TABLET | Freq: Every day | ORAL | 1 refills | Status: DC
Start: 2017-01-09 — End: 2017-07-19

## 2017-01-09 MED ORDER — SERTRALINE HCL 50 MG PO TABS: 50.0000 mg | ORAL_TABLET | Freq: Every day | ORAL | 1 refills | Status: DC

## 2017-01-09 MED ORDER — LOSARTAN POTASSIUM-HCTZ 50-12.5 MG PO TABS: 1.0000 | ORAL_TABLET | Freq: Every day | ORAL | 1 refills | Status: DC

## 2017-01-09 MED ORDER — PREDNISONE 20 MG PO TABS: ORAL_TABLET | ORAL | 0 refills | Status: DC

## 2017-01-09 NOTE — Progress Notes (Signed)
PRIMARY CARE AT [place], [place] [address] [phone]  Date:  01/09/2017   Name:  Edward Erickson   DOB:  1965-07-21   MRN:  [id number]  PCP:  Patient, No Pcp Per    History of Present Illness:  Edward Erickson is a 51 y.o. male patient who presents to PCP with  Chief Complaint  Patient presents with  . Medication Refill    all medications     HTN: blood pressure 128/80 at home.   He stopped eating fast foods, and fried foods.  The drinking is about 2-3 bottles per day.  Takes medications compliantly.   He has difficulty with speaking 3-4 months.  Brushing teeth with blood from gums. No dyspnea or sob.  He has no trouble swallowing.  The symptoms have been the same.  He is a non-smoker.  He has minimal congestion at times.  He at times feels like he is swallowing his uvula.  The throat will hurt at times intermittently, however this has been tolerable.  Non-smoker He has a hx of heartburn.  No recent heartburn, nausea, or sour brash.   Wt Readings from Last 3 Encounters:  01/09/17 214 lb (97.1 kg)  03/01/16 218 lb (98.9 kg)  08/31/15 218 lb (98.9 kg)   ANXIETY: this is under control.  The Zoloft is working, he reports.   Patient Active Problem List   Diagnosis Date Noted  . OSA on CPAP 07/30/2013    Past Medical History:  Diagnosis Date  . Apnea   . Asthma   . Hyperlipidemia    mild  . Hypertension   . OSA on CPAP 07/30/2013  . Snoring     History reviewed. No pertinent surgical history.  Social History  Substance Use Topics  . Smoking status: Never Smoker  . Smokeless tobacco: Never Used  . Alcohol use Yes    Family History  Problem Relation Age of Onset  . Hypertension Mother   . Hypertension Father   . Snoring Brother        all 3 brothers  . Snoring Sister        all 3 sisters    No Known Allergies  Medication list has been reviewed and updated.  Current Outpatient Prescriptions on File Prior to Visit  Medication Sig  Dispense Refill  . amLODipine (NORVASC) 10 MG tablet Take 1 tablet (10 mg total) by mouth daily. 90 tablet 3  . atorvastatin (LIPITOR) 20 MG tablet TAKE 1 TABLET(20 MG) BY MOUTH EVERY EVENING WITH DINNER 90 tablet 1  . diclofenac (VOLTAREN) 75 MG EC tablet TAKE 1 TABLET(75 MG) BY MOUTH TWICE DAILY 60 tablet 0  . fluticasone (FLONASE) 50 MCG/ACT nasal spray Place 1 spray into both nostrils at bedtime. 16 g 5  . losartan-hydrochlorothiazide (HYZAAR) 50-12.5 MG tablet Take 1 tablet by mouth daily. Follow up due Sept 2018 90 tablet 0  . metoprolol (LOPRESSOR) 50 MG tablet Take 1 tablet (50 mg total) by mouth 2 (two) times daily. 180 tablet 3  . potassium chloride SA (K-DUR,KLOR-CON) 20 MEQ tablet TAKE 1 TABLET BY MOUTH EVERY DAY 90 tablet 0  . sertraline (ZOLOFT) 50 MG tablet Take 1 tablet (50 mg total) by mouth daily. No more refills without office visit 15 tablet 0   No current facility-administered medications on file prior to visit.     ROS ROS otherwise unremarkable unless listed above.  Physical Examination: BP 131/84   Pulse (!) 55  Temp (!) 97.5 F (36.4 C) (Oral)   Resp 18   Ht 5' 6"  (1.676 m)   Wt 214 lb (97.1 kg)   SpO2 96%   BMI 34.54 kg/m  Ideal Body Weight: Weight in (lb) to have BMI = 25: 154.6  Physical Exam  Constitutional: He is oriented to person, place, and time. He appears well-developed and well-nourished. No distress.  HENT:  Head: Normocephalic and atraumatic.  Mouth/Throat: Posterior oropharyngeal edema and posterior oropharyngeal erythema present. No tonsillar abscesses.  Uvula is slight inflammed and erythematous.   Eyes: Pupils are equal, round, and reactive to light. Conjunctivae and EOM are normal.  Cardiovascular: Normal rate, regular rhythm, normal heart sounds and intact distal pulses.  Exam reveals no gallop and no friction rub.   No murmur heard. Pulses:      Radial pulses are 2+ on the right side, and 2+ on the left side.       Dorsalis pedis  pulses are 2+ on the right side, and 2+ on the left side.  Pulmonary/Chest: Effort normal. No accessory muscle usage. No apnea. No respiratory distress. He has no wheezes. He has no rhonchi. He has no rales.  Lymphadenopathy:       Head (right side): No submandibular and no tonsillar adenopathy present.       Head (left side): No submandibular and no tonsillar adenopathy present.  Neurological: He is alert and oriented to person, place, and time.  Skin: Skin is warm and dry. He is not diaphoretic.  Psychiatric: He has a normal mood and affect. His behavior is normal.     Assessment and Plan: Edward Erickson is a 51 y.o. male who is here today for cc of blood pressure recheck, medication refill, and hoarseness. Advised heavy hydration.  I will treat this with abx and prednisone taper at this time.  To follow up in 2 weeks.  If this has not improved, will look to ENT consult. Strep culture today Blood pressure medications refilled for 6 months.  rtc at that time [facility] to fill for additional 6 months of the Zoloft to total 1 year. Essential hypertension - Plan: CMP14+EGFR, Lipid panel, TSH, potassium chloride SA (K-DUR,KLOR-CON) 20 MEQ tablet, metoprolol tartrate (LOPRESSOR) 50 MG tablet, losartan-hydrochlorothiazide (HYZAAR) 50-12.5 MG tablet, amLODipine (NORVASC) 10 MG tablet, CANCELED: CBC  Hoarseness - Plan: TSH, amoxicillin-clavulanate (AUGMENTIN) 875-125 MG tablet, predniSONE (DELTASONE) 20 MG tablet, POCT rapid strep A, Culture, Group A Strep, CBC with Differential/Platelet, CANCELED: CBC  Generalized anxiety disorder - Plan: sertraline (ZOLOFT) 50 MG tablet  Uvulitis - Plan: amoxicillin-clavulanate (AUGMENTIN) 875-125 MG tablet, predniSONE (DELTASONE) 20 MG tablet, POCT rapid strep A, Culture, Group A Strep, CBC with Differential/Platelet  Ivar Drape, PA-C [facility] 7/16/201811:55 AM

## 2017-01-09 NOTE — Patient Instructions (Addendum)
I would like you to take the medication as prescribed.   Make sure that you are hydrating well with 64 oz of water if not more.    Take the medication as prescribed for the hoarseness.  augmentin and the prednisone.  If there is no improvement after 10-14 days, you need to return.  We will likely then send you to an ear nose and throat doctor.     IF you received an x-ray today, you will receive an invoice from [facility]. Please contact [facility] at [phone] with questions or concerns regarding your invoice.   IF you received labwork today, you will receive an invoice from [facility]. Please contact [facility] at [phone] with questions or concerns regarding your invoice.   Our billing staff will not be able to assist you with questions regarding bills from these companies.  You will be contacted with the lab results as soon as they are available. The fastest way to get your results is to activate your My Chart account. Instructions are located on the last page of this paperwork. If you have not heard from [place] regarding the results in 2 weeks, please contact this office.

## 2017-01-10 LAB — CMP14+EGFR
A/G RATIO: 1.2 (ref 1.2–2.2)
ALT: 29 IU/L (ref 0–44)
AST: 30 IU/L (ref 0–40)
Albumin: 4.2 g/dL (ref 3.5–5.5)
Alkaline Phosphatase: 98 IU/L (ref 39–117)
BUN/Creatinine Ratio: 14 (ref 9–20)
BUN: 12 mg/dL (ref 6–24)
Bilirubin Total: 0.4 mg/dL (ref 0.0–1.2)
CALCIUM: 8.9 mg/dL (ref 8.7–10.2)
CO2: 25 mmol/L (ref 20–29)
CREATININE: 0.86 mg/dL (ref 0.76–1.27)
Chloride: 100 mmol/L (ref 96–106)
GFR, EST AFRICAN AMERICAN: 117 mL/min/{1.73_m2} (ref >59)
GFR, EST NON AFRICAN AMERICAN: 101 mL/min/{1.73_m2} (ref >59)
Globulin, Total: 3.4 g/dL (ref 1.5–4.5)
Glucose: 88 mg/dL (ref 65–99)
POTASSIUM: 3.5 mmol/L (ref 3.5–5.2)
Sodium: 143 mmol/L (ref 134–144)
Total Protein: 7.6 g/dL (ref 6.0–8.5)

## 2017-01-10 LAB — CBC
HEMATOCRIT: 42 % (ref 37.5–51.0)
Hemoglobin: 13.4 g/dL (ref 13.0–17.7)
MCH: 26.6 pg (ref 26.6–33.0)
MCHC: 31.9 g/dL (ref 31.5–35.7)
MCV: 84 fL (ref 79–97)
PLATELETS: 152 10*3/uL (ref 150–379)
RBC: 5.03 x10E6/uL (ref 4.14–5.80)
RDW: 15.6 % — AB (ref 12.3–15.4)
WBC: 4.7 10*3/uL (ref 3.4–10.8)

## 2017-01-10 LAB — LIPID PANEL
CHOL/HDL RATIO: 4.1 ratio (ref 0.0–5.0)
Cholesterol, Total: 180 mg/dL (ref 100–199)
HDL: 44 mg/dL (ref >39)
LDL CALC: 117 mg/dL — AB (ref 0–99)
TRIGLYCERIDES: 95 mg/dL (ref 0–149)
VLDL CHOLESTEROL CAL: 19 mg/dL (ref 5–40)

## 2017-01-10 LAB — TSH: TSH: 0.517 u[IU]/mL (ref 0.450–4.500)

## 2017-01-12 LAB — CULTURE, GROUP A STREP: Strep A Culture: NEGATIVE

## 2017-01-30 ENCOUNTER — Ambulatory Visit (INDEPENDENT_AMBULATORY_CARE_PROVIDER_SITE_OTHER): Payer: 59 | Admitting: Physician Assistant

## 2017-01-30 ENCOUNTER — Encounter: Payer: Self-pay | Admitting: Physician Assistant

## 2017-01-30 ENCOUNTER — Ambulatory Visit (INDEPENDENT_AMBULATORY_CARE_PROVIDER_SITE_OTHER): Payer: 59

## 2017-01-30 VITALS — BP 136/80 | HR 62 | Temp 98.2°F | Resp 16 | Ht 66.0 in | Wt 214.0 lb

## 2017-01-30 DIAGNOSIS — M25562 Pain in left knee: Secondary | ICD-10-CM

## 2017-01-30 DIAGNOSIS — R490 Dysphonia: Secondary | ICD-10-CM

## 2017-01-30 DIAGNOSIS — G8929 Other chronic pain: Secondary | ICD-10-CM

## 2017-01-30 NOTE — Patient Instructions (Addendum)
I will contact you on the results of your knee xray.  We only have an ultrasound, and it would be wise to get radiograph imaging prior to ortho visit.  I would like you to await contact for ear, nose and throat.      IF you received an x-ray today, you will receive an invoice from [facility]. Please contact [facility] at [phone] with questions or concerns regarding your invoice.   IF you received labwork today, you will receive an invoice from [facility]. Please contact [facility] at [phone] with questions or concerns regarding your invoice.   Our billing staff will not be able to assist you with questions regarding bills from these companies.  You will be contacted with the lab results as soon as they are available. The fastest way to get your results is to activate your My Chart account. Instructions are located on the last page of this paperwork. If you have not heard from [place] regarding the results in 2 weeks, please contact this office.

## 2017-01-30 NOTE — Progress Notes (Signed)
PRIMARY CARE AT [place], [place] [address] [phone]  Date:  01/30/2017   Name:  BASHEER MOLCHAN   DOB:  07-04-1965   MRN:  [id number]  PCP:  Patient, No Pcp Per    History of Present Illness:  IRVING BLOOR is a 51 y.o. male patient who presents to PCP with  Chief Complaint  Patient presents with  . Follow-up    hoarseness/ pt states he is feeling a little better.  . Leg Pain    chronic issue/ x 1 wk left leg     He continues to have the hoarseness, though he feels taht he is not having sensation of swallowing the uvula.  The bleeding of his gums, which he relays today has also improved.  He notes no coughing.  He is a non-smoker.  May have 3 shots of etoH 2 days per week.  Works at [employer].  No loud speaking or singing.   Left leg pain worsened for 1 week.  This is chronic for the last several years.  Hurts to flex his knee back.  Some walking will aggravate his symptoms.  No swelling, numbness or tingling, or weakness.  No trauma known. Wt Readings from Last 3 Encounters:  01/30/17 214 lb (97.1 kg)  01/09/17 214 lb (97.1 kg)  03/01/16 218 lb (98.9 kg)      Patient Active Problem List   Diagnosis Date Noted  . OSA on CPAP 07/30/2013    Past Medical History:  Diagnosis Date  . Apnea   . Asthma   . Hyperlipidemia    mild  . Hypertension   . OSA on CPAP 07/30/2013  . Snoring     No past surgical history on file.  Social History  Substance Use Topics  . Smoking status: Never Smoker  . Smokeless tobacco: Never Used  . Alcohol use Yes    Family History  Problem Relation Age of Onset  . Hypertension Mother   . Hypertension Father   . Snoring Brother        all 3 brothers  . Snoring Sister        all 3 sisters    No Known Allergies  Medication list has been reviewed and updated.  Current Outpatient Prescriptions on File Prior to Visit  Medication Sig Dispense Refill  . amLODipine (NORVASC) 10 MG  tablet Take 1 tablet (10 mg total) by mouth daily. 90 tablet 1  . atorvastatin (LIPITOR) 20 MG tablet TAKE 1 TABLET(20 MG) BY MOUTH EVERY EVENING WITH DINNER 90 tablet 1  . diclofenac (VOLTAREN) 75 MG EC tablet TAKE 1 TABLET(75 MG) BY MOUTH TWICE DAILY 60 tablet 0  . fluticasone (FLONASE) 50 MCG/ACT nasal spray Place 1 spray into both nostrils at bedtime. 16 g 5  . losartan-hydrochlorothiazide (HYZAAR) 50-12.5 MG tablet Take 1 tablet by mouth daily. Follow up due Sept 2018 90 tablet 1  . metoprolol tartrate (LOPRESSOR) 50 MG tablet Take 1 tablet (50 mg total) by mouth 2 (two) times daily. 180 tablet 1  . potassium chloride SA (K-DUR,KLOR-CON) 20 MEQ tablet Take 1 tablet (20 mEq total) by mouth daily. 90 tablet 1  . predniSONE (DELTASONE) 20 MG tablet Take 3 PO QAM x2days, 2 PO QAM x2days, 1 PO QAM x3days 13 tablet 0  . sertraline (ZOLOFT) 50 MG tablet Take 1 tablet (50 mg total) by mouth daily. 90 tablet 1   No current facility-administered medications on file prior to visit.  ROS ROS otherwise unremarkable unless listed above.  Physical Examination: BP 136/80   Pulse 62   Temp 98.2 F (36.8 C) (Oral)   Resp 16   Ht 5\' 6"  (1.676 m)   Wt 214 lb (97.1 kg)   SpO2 98%   BMI 34.54 kg/m  Ideal Body Weight: Weight in (lb) to have BMI = 25: 154.6  Physical Exam  Constitutional: He is oriented to person, place, and time. He appears well-developed and well-nourished. No distress.  HENT:  Head: Normocephalic and atraumatic.  Mouth/Throat: Posterior oropharyngeal erythema (mildly) present. No oropharyngeal exudate or posterior oropharyngeal edema.  Uvula long however not edematous.  Eyes: Pupils are equal, round, and reactive to light. Conjunctivae and EOM are normal.  Cardiovascular: Normal rate.   Pulmonary/Chest: Effort normal. No respiratory distress.  Musculoskeletal:       Left knee: He exhibits normal range of motion, no swelling, no LCL laxity, normal patellar mobility, no bony  tenderness and no MCL laxity. No medial joint line, no lateral joint line, no MCL and no LCL tenderness noted.  Posterior knee pain upon palpation.  Pain incited with flexion of the knee passive and active.    Neurological: He is alert and oriented to person, place, and time.  Skin: Skin is warm and dry. He is not diaphoretic.  Psychiatric: He has a normal mood and affect. His behavior is normal.   Dg Knee Complete 4 Views Left  Result Date: 01/30/2017 CLINICAL DATA:  Pain EXAM: LEFT KNEE - COMPLETE 4+ VIEW COMPARISON:  None. FINDINGS: Frontal, tunnel, lateral, and sunrise patellar images were obtained. There is no evident fracture or dislocation. No joint effusion. There is mild narrowing medially. Other joint spaces appear unremarkable. No erosive change. IMPRESSION: Mild joint space narrowing medially.  No fracture or joint effusion. Electronically Signed   By: Lowella Grip III M.D.   On: 01/30/2017 09:39     Assessment and Plan: CAMARION WEIER is a 51 y.o. male who is here today for posterior knee pain 2016 imaging revealed no baker's cyst.  Will have consult with ortho out of patient request and chronic symptoms. Will also refer to ENT for this hoarseness. Chronic pain of left knee - Plan: DG Knee Complete 4 Views Left  Acute pain of left knee - Plan: DG Knee Complete 4 Views Left  Hoarseness - Plan: Ambulatory referral to ENT  Ivar Drape, PA-C [facility] 8/6/20187:48 PM

## 2017-02-21 ENCOUNTER — Ambulatory Visit (INDEPENDENT_AMBULATORY_CARE_PROVIDER_SITE_OTHER): Payer: 59 | Admitting: Orthopaedic Surgery

## 2017-04-20 ENCOUNTER — Other Ambulatory Visit: Payer: Self-pay | Admitting: Family Medicine

## 2017-04-27 ENCOUNTER — Encounter: Payer: Self-pay | Admitting: Physician Assistant

## 2017-04-27 ENCOUNTER — Ambulatory Visit (INDEPENDENT_AMBULATORY_CARE_PROVIDER_SITE_OTHER): Payer: 59 | Admitting: Physician Assistant

## 2017-04-27 ENCOUNTER — Ambulatory Visit (INDEPENDENT_AMBULATORY_CARE_PROVIDER_SITE_OTHER): Payer: 59

## 2017-04-27 VITALS — BP 124/86 | HR 66 | Temp 98.4°F | Resp 16 | Ht 66.0 in | Wt 208.0 lb

## 2017-04-27 DIAGNOSIS — M25562 Pain in left knee: Secondary | ICD-10-CM | POA: Diagnosis not present

## 2017-04-27 DIAGNOSIS — M7732 Calcaneal spur, left foot: Secondary | ICD-10-CM | POA: Diagnosis not present

## 2017-04-27 DIAGNOSIS — R490 Dysphonia: Secondary | ICD-10-CM | POA: Diagnosis not present

## 2017-04-27 DIAGNOSIS — I10 Essential (primary) hypertension: Secondary | ICD-10-CM | POA: Diagnosis not present

## 2017-04-27 DIAGNOSIS — M79672 Pain in left foot: Secondary | ICD-10-CM

## 2017-04-27 MED ORDER — DICLOFENAC SODIUM 75 MG PO TBEC: DELAYED_RELEASE_TABLET | ORAL | 0 refills | Status: DC

## 2017-04-27 NOTE — Progress Notes (Deleted)
PRIMARY CARE AT [place], [place] [address] [phone]  Date:  04/27/2017   Name:  Edward Erickson   DOB:  Jan 08, 1966   MRN:  [id number]  PCP:  Patient, No Pcp Per    History of Present Illness:  Edward Erickson is a 51 y.o. male patient who presents to PCP with No chief complaint on file.      Patient Active Problem List   Diagnosis Date Noted  . OSA on CPAP 07/30/2013    Past Medical History:  Diagnosis Date  . Apnea   . Asthma   . Hyperlipidemia    mild  . Hypertension   . OSA on CPAP 07/30/2013  . Snoring     No past surgical history on file.  Social History  Substance Use Topics  . Smoking status: Never Smoker  . Smokeless tobacco: Never Used  . Alcohol use Yes    Family History  Problem Relation Age of Onset  . Hypertension Mother   . Hypertension Father   . Snoring Brother        all 3 brothers  . Snoring Sister        all 3 sisters    No Known Allergies  Medication list has been reviewed and updated.  Current Outpatient Prescriptions on File Prior to Visit  Medication Sig Dispense Refill  . amLODipine (NORVASC) 10 MG tablet Take 1 tablet (10 mg total) by mouth daily. 90 tablet 1  . atorvastatin (LIPITOR) 20 MG tablet TAKE 1 TABLET(20 MG) BY MOUTH EVERY EVENING WITH DINNER 90 tablet 1  . diclofenac (VOLTAREN) 75 MG EC tablet TAKE 1 TABLET(75 MG) BY MOUTH TWICE DAILY 60 tablet 0  . fluticasone (FLONASE) 50 MCG/ACT nasal spray Place 1 spray into both nostrils at bedtime. 16 g 5  . losartan-hydrochlorothiazide (HYZAAR) 50-12.5 MG tablet Take 1 tablet by mouth daily. Follow up due Sept 2018 90 tablet 1  . metoprolol tartrate (LOPRESSOR) 50 MG tablet Take 1 tablet (50 mg total) by mouth 2 (two) times daily. 180 tablet 1  . potassium chloride SA (K-DUR,KLOR-CON) 20 MEQ tablet Take 1 tablet (20 mEq total) by mouth daily. 90 tablet 1  . predniSONE (DELTASONE) 20 MG tablet Take 3 PO QAM x2days, 2 PO QAM x2days, 1 PO QAM x3days 13  tablet 0  . sertraline (ZOLOFT) 50 MG tablet Take 1 tablet (50 mg total) by mouth daily. 90 tablet 1   No current facility-administered medications on file prior to visit.     ROS ROS otherwise unremarkable unless listed above.  Physical Examination: There were no vitals taken for this visit. Ideal Body Weight:    Physical Exam   Assessment and Plan: Edward Erickson is a 51 y.o. male who is here today  There are no diagnoses linked to this encounter.  Ivar Drape, PA-C [facility] 04/27/2017 9:12 AM

## 2017-04-27 NOTE — Progress Notes (Signed)
PRIMARY CARE AT [place], [place] [id number] [phone]  Date:  04/27/2017   Name:  Edward Erickson   DOB:  [birth year]   MRN:  [id number]  PCP:  Patient, No Pcp Per    History of Present Illness:  Edward Erickson is a 51 y.o. male patient who presents to PCP with  Chief Complaint  Patient presents with  . Leg Pain    left/ x 2 wks  . Sore Throat    pt states throat is dry  . Medication Refill     Patient states that he has pain of his left foot.  This is located at his heel.  He will feel in his heel, and improves with ambulation during the day.  He still continues to have pain of his left knee.  This is at the posterior aspect.  He has been seen here for this.  The knee is not unstable.  No swelling.  No paresthesia.  Pain radiates along the outer (lateral area and radiates down to half calf).    He states that he has sore throat and feels dry.  He was to show up to ENT, but this was a no show as well.  Reports that he was travelling.  Continues to feel hoarse.  BP: no cp, palpitations, or sob.  He notes that he is taking BP meds compliantly. Wt Readings from Last 3 Encounters:  04/27/17 208 lb (94.3 kg)  01/30/17 214 lb (97.1 kg)  01/09/17 214 lb (97.1 kg)      Patient Active Problem List   Diagnosis Date Noted  . OSA on CPAP 07/30/2013    Past Medical History:  Diagnosis Date  . Apnea   . Asthma   . Hyperlipidemia    mild  . Hypertension   . OSA on CPAP 07/30/2013  . Snoring     No past surgical history on file.  Social History  Substance Use Topics  . Smoking status: Never Smoker  . Smokeless tobacco: Never Used  . Alcohol use Yes    Family History  Problem Relation Age of Onset  . Hypertension Mother   . Hypertension Father   . Snoring Brother        all 3 brothers  . Snoring Sister        all 3 sisters    No Known Allergies  Medication list has been reviewed and updated.  Current Outpatient Prescriptions on File  Prior to Visit  Medication Sig Dispense Refill  . amLODipine (NORVASC) 10 MG tablet Take 1 tablet (10 mg total) by mouth daily. 90 tablet 1  . atorvastatin (LIPITOR) 20 MG tablet TAKE 1 TABLET(20 MG) BY MOUTH EVERY EVENING WITH DINNER 90 tablet 1  . diclofenac (VOLTAREN) 75 MG EC tablet TAKE 1 TABLET(75 MG) BY MOUTH TWICE DAILY 60 tablet 0  . fluticasone (FLONASE) 50 MCG/ACT nasal spray Place 1 spray into both nostrils at bedtime. 16 g 5  . losartan-hydrochlorothiazide (HYZAAR) 50-12.5 MG tablet Take 1 tablet by mouth daily. Follow up due Sept 2018 90 tablet 1  . metoprolol tartrate (LOPRESSOR) 50 MG tablet Take 1 tablet (50 mg total) by mouth 2 (two) times daily. 180 tablet 1  . potassium chloride SA (K-DUR,KLOR-CON) 20 MEQ tablet Take 1 tablet (20 mEq total) by mouth daily. 90 tablet 1  . predniSONE (DELTASONE) 20 MG tablet Take 3 PO QAM x2days, 2 PO QAM x2days, 1 PO QAM x3days 13 tablet 0  .  sertraline (ZOLOFT) 50 MG tablet Take 1 tablet (50 mg total) by mouth daily. 90 tablet 1   No current facility-administered medications on file prior to visit.     ROS ROS otherwise unremarkable unless listed above.  Physical Examination: BP 124/86   Pulse 66   Temp 98.4 F (36.9 C) (Oral)   Resp 16   Ht 5\' 6"  (1.676 m)   Wt 208 lb (94.3 kg)   SpO2 98%   BMI 33.57 kg/m  Ideal Body Weight: Weight in (lb) to have BMI = 25: 154.6  Physical Exam  Constitutional: He is oriented to person, place, and time. He appears well-developed and well-nourished. No distress.  HENT:  Head: Normocephalic and atraumatic.  Mouth/Throat: Uvula swelling (minimal) present.  Eyes: Conjunctivae and EOM are normal. Pupils are equal, round, and reactive to light.  Cardiovascular: Normal rate.  Pulmonary/Chest: Effort normal. No respiratory distress.  Musculoskeletal:       Left knee: He exhibits normal range of motion, no swelling and normal meniscus. Tenderness found. No medial joint line, no lateral joint line,  no MCL, no LCL and no patellar tendon tenderness noted.       Left ankle: No lateral malleolus and no medial malleolus tenderness found. Achilles tendon normal.  posterior-lateral tenderness at the left knee without erythema or ROM deficit.  Left heel with tenderness at the distal plantar area of the calcaneus.  Swelling not detected nor erythema.   Neurological: He is alert and oriented to person, place, and time.  Skin: Skin is warm and dry. He is not diaphoretic.  Psychiatric: He has a normal mood and affect. His behavior is normal.    Dg Os Calcis Left  Result Date: 04/27/2017 CLINICAL DATA:  Left heel pain for 2 weeks.  No known injury. EXAM: LEFT OS CALCIS - 2+ VIEW COMPARISON:  None. FINDINGS: There is no evidence of fracture or other focal bone lesions. Mild spurring at the Achilles tendon insertion on the calcaneus is noted. Soft tissues are unremarkable. IMPRESSION: Mild spurring at the Achilles tendon insertion on the calcaneus. The study is otherwise unremarkable. Electronically Signed   By: Inge Rise M.D.   On: 04/27/2017 09:57     Assessment and Plan: Edward Erickson is a 51 y.o. male who is here today for cc of  Chief Complaint  Patient presents with  . Leg Pain    left/ x 2 wks  . Sore Throat    pt states throat is dry  . Medication Refill  --advised iced soaks of the heel --heel cups given today --reordered the referral to both ENT, and ortho at this time.  Given strict orders to follow through with visit.  Pain of left heel - Plan: DG Os Calcis Left, diclofenac (VOLTAREN) 75 MG EC tablet  Essential hypertension - Plan: Basic metabolic panel  Acute pain of left knee - Plan: diclofenac (VOLTAREN) 75 MG EC tablet, AMB referral to orthopedics  Calcaneal spur of left foot - Plan: diclofenac (VOLTAREN) 75 MG EC tablet  Hoarseness - Plan: Ambulatory referral to ENT  Ivar Drape, PA-C [facility] 11/6/201811:02 AM

## 2017-04-27 NOTE — Patient Instructions (Addendum)
Ice the foot three times per day.  Soaks are recommended.   You can take diclofenac for pain.  Please await contact for your referral.  You must make this appointment.  Heel Spur A heel spur is a bony growth that forms on the bottom of your heel bone (calcaneus). Heel spurs are common and do not always cause pain. However, heel spurs often cause inflammation in the strong band of tissue that runs underneath the bone of your foot (plantar fascia). When this happens, you may feel pain on the bottom of your foot, near your heel. What are the causes? The cause of heel spurs is not completely understood. They may be caused by pressure on the heel. Or, they may stem from the muscle attachments (tendons) near the spur pulling on the heel. What increases the risk? You may be at risk for a heel spur if you:  Are older than 40.  Are overweight.  Have wear and tear arthritis (osteoarthritis).  Have plantar fascia inflammation.  What are the signs or symptoms? Some people have heel spurs but no symptoms. If you do have symptoms, they may include:  Pain in the bottom of your heel.  Pain that is worse when you first get out of bed.  Pain that gets worse after walking or standing.  How is this diagnosed? Your health care provider may diagnose a heel spur based on your symptoms and a physical exam. You may also have an X-ray of your foot to check for a bony growth coming from the calcaneus. How is this treated? Treatment aims to relieve the pain from the heel spur. This may include:  Stretching exercises.  Losing weight.  Wearing specific shoes, inserts, or orthotics for comfort and support.  Wearing splints at night to properly position your feet.  Taking over-the-counter medicine to relieve pain.  Being treated with high-intensity sound waves to break up the heel spur (extracorporeal shock wave therapy).  Getting steroid injections in your heel to reduce swelling and ease  pain.  Having surgery if your heel spur causes long-term (chronic) pain.  Follow these instructions at home:  Take medicines only as directed by your health care provider.  Ask your health care provider if you should use ice or cold packs on the painful areas of your heel or foot.  Avoid activities that cause you pain until you recover or as directed by your health care provider.  Stretch before exercising or being physically active.  Wear supportive shoes that fit well as directed by your health care provider. You might need to buy new shoes. Wearing old shoes or shoes that do not fit correctly may not provide the support that you need.  Lose weight if your health care provider thinks you should. This can relieve pressure on your foot that may be causing pain and discomfort. Contact a health care provider if:  Your pain continues or gets worse. This information is not intended to replace advice given to you by your health care provider. Make sure you discuss any questions you have with your health care provider. Document Released: 07/20/2005 Document Revised: 11/19/2015 Document Reviewed: 08/14/2013 Elsevier Interactive Patient Education  2018 [employer].     IF you received an x-ray today, you will receive an invoice from [facility]. Please contact [facility] at [phone] with questions or concerns regarding your invoice.   IF you received labwork today, you will receive an invoice from [facility]. Please contact [facility] at [phone] with questions  or concerns regarding your invoice.   Our billing staff will not be able to assist you with questions regarding bills from these companies.  You will be contacted with the lab results as soon as they are available. The fastest way to get your results is to activate your My Chart account. Instructions are located on the last page of this paperwork. If you have not heard from [place] regarding the results in 2 weeks,  please contact this office.

## 2017-04-28 LAB — BASIC METABOLIC PANEL
BUN/Creatinine Ratio: 16 (ref 9–20)
BUN: 12 mg/dL (ref 6–24)
CALCIUM: 8.9 mg/dL (ref 8.7–10.2)
CHLORIDE: 101 mmol/L (ref 96–106)
CO2: 27 mmol/L (ref 20–29)
Creatinine, Ser: 0.75 mg/dL — ABNORMAL LOW (ref 0.76–1.27)
GFR calc non Af Amer: 107 mL/min/{1.73_m2} (ref >59)
GFR, EST AFRICAN AMERICAN: 124 mL/min/{1.73_m2} (ref >59)
GLUCOSE: 93 mg/dL (ref 65–99)
Potassium: 3.7 mmol/L (ref 3.5–5.2)
Sodium: 142 mmol/L (ref 134–144)

## 2017-05-02 ENCOUNTER — Encounter: Payer: Self-pay | Admitting: *Deleted

## 2017-05-02 ENCOUNTER — Encounter: Payer: Self-pay | Admitting: Physician Assistant

## 2017-05-31 ENCOUNTER — Other Ambulatory Visit: Payer: Self-pay | Admitting: Otolaryngology

## 2017-06-02 ENCOUNTER — Encounter (HOSPITAL_BASED_OUTPATIENT_CLINIC_OR_DEPARTMENT_OTHER)
Admission: RE | Admit: 2017-06-02 | Discharge: 2017-06-02 | Disposition: A | Discharge status: Home / Self Care | Payer: 59 | Source: Ambulatory Visit | Attending: Otolaryngology | Admitting: Otolaryngology

## 2017-06-02 DIAGNOSIS — I10 Essential (primary) hypertension: Secondary | ICD-10-CM | POA: Insufficient documentation

## 2017-06-02 DIAGNOSIS — Z01818 Encounter for other preprocedural examination: Secondary | ICD-10-CM | POA: Insufficient documentation

## 2017-06-02 LAB — BASIC METABOLIC PANEL
Anion gap: 5 (ref 5–15)
BUN: 13 mg/dL (ref 6–20)
CHLORIDE: 101 mmol/L (ref 101–111)
CO2: 32 mmol/L (ref 22–32)
Calcium: 8.8 mg/dL — ABNORMAL LOW (ref 8.9–10.3)
Creatinine, Ser: 0.87 mg/dL (ref 0.61–1.24)
GFR calc Af Amer: >60 mL/min (ref >60)
GFR calc non Af Amer: >60 mL/min (ref >60)
GLUCOSE: 126 mg/dL — AB (ref 65–99)
POTASSIUM: 3.4 mmol/L — AB (ref 3.5–5.1)
Sodium: 138 mmol/L (ref 135–145)

## 2017-06-02 NOTE — Progress Notes (Signed)
Pt in for PAT appt, EKG reviewed with Dr. Deatra Canter.

## 2017-06-06 ENCOUNTER — Encounter (HOSPITAL_BASED_OUTPATIENT_CLINIC_OR_DEPARTMENT_OTHER): Payer: Self-pay | Admitting: Anesthesiology

## 2017-06-06 ENCOUNTER — Other Ambulatory Visit: Payer: Self-pay

## 2017-06-06 ENCOUNTER — Encounter (HOSPITAL_BASED_OUTPATIENT_CLINIC_OR_DEPARTMENT_OTHER)
Admission: RE | Disposition: A | Discharge status: Home / Self Care | Payer: Self-pay | Source: Ambulatory Visit | Attending: Otolaryngology

## 2017-06-06 ENCOUNTER — Ambulatory Visit (HOSPITAL_BASED_OUTPATIENT_CLINIC_OR_DEPARTMENT_OTHER)
Admission: RE | Admit: 2017-06-06 | Discharge: 2017-06-06 | Disposition: A | Discharge status: Home / Self Care | Payer: 59 | Source: Ambulatory Visit | Attending: Otolaryngology | Admitting: Otolaryngology

## 2017-06-06 DIAGNOSIS — K219 Gastro-esophageal reflux disease without esophagitis: Secondary | ICD-10-CM | POA: Diagnosis not present

## 2017-06-06 DIAGNOSIS — J45909 Unspecified asthma, uncomplicated: Secondary | ICD-10-CM | POA: Diagnosis not present

## 2017-06-06 DIAGNOSIS — I10 Essential (primary) hypertension: Secondary | ICD-10-CM | POA: Insufficient documentation

## 2017-06-06 DIAGNOSIS — Z79899 Other long term (current) drug therapy: Secondary | ICD-10-CM | POA: Diagnosis not present

## 2017-06-06 DIAGNOSIS — D141 Benign neoplasm of larynx: Secondary | ICD-10-CM | POA: Insufficient documentation

## 2017-06-06 DIAGNOSIS — G4733 Obstructive sleep apnea (adult) (pediatric): Secondary | ICD-10-CM | POA: Insufficient documentation

## 2017-06-06 HISTORY — PX: DIRECT LARYNGOSCOPY: SHX5326

## 2017-06-06 SURGERY — LARYNGOSCOPY, DIRECT
Anesthesia: General | Site: Throat | Laterality: Right

## 2017-06-06 MED ORDER — MIDAZOLAM HCL 2 MG/2ML IJ SOLN: 1.0000 mg | INTRAMUSCULAR | Status: DC | PRN

## 2017-06-06 MED ORDER — LACTATED RINGERS IV SOLN
INTRAVENOUS | Status: DC
  Administered 2017-06-06 (×2): via INTRAVENOUS

## 2017-06-06 MED ORDER — DEXAMETHASONE SODIUM PHOSPHATE 10 MG/ML IJ SOLN
INTRAMUSCULAR | Status: AC
  Filled 2017-06-06: qty 1

## 2017-06-06 MED ORDER — EPINEPHRINE PF 1 MG/ML IJ SOLN
INTRAMUSCULAR | Status: DC | PRN
  Administered 2017-06-06: 1 mg

## 2017-06-06 MED ORDER — CEFAZOLIN SODIUM-DEXTROSE 2-3 GM-%(50ML) IV SOLR
INTRAVENOUS | Status: DC | PRN
  Administered 2017-06-06: 2 g via INTRAVENOUS

## 2017-06-06 MED ORDER — ONDANSETRON HCL 4 MG/2ML IJ SOLN
INTRAMUSCULAR | Status: DC | PRN
  Administered 2017-06-06: 4 mg via INTRAVENOUS

## 2017-06-06 MED ORDER — ONDANSETRON HCL 4 MG/2ML IJ SOLN: 4.0000 mg | Freq: Once | INTRAMUSCULAR | Status: DC | PRN

## 2017-06-06 MED ORDER — LIDOCAINE 2% (20 MG/ML) 5 ML SYRINGE
INTRAMUSCULAR | Status: AC
  Filled 2017-06-06: qty 5

## 2017-06-06 MED ORDER — FENTANYL CITRATE (PF) 100 MCG/2ML IJ SOLN
25.0000 ug | INTRAMUSCULAR | Status: DC | PRN
  Administered 2017-06-06 (×2): 50 ug via INTRAVENOUS

## 2017-06-06 MED ORDER — MIDAZOLAM HCL 2 MG/2ML IJ SOLN
INTRAMUSCULAR | Status: AC
  Filled 2017-06-06: qty 2

## 2017-06-06 MED ORDER — FENTANYL CITRATE (PF) 100 MCG/2ML IJ SOLN
INTRAMUSCULAR | Status: DC | PRN
  Administered 2017-06-06: 100 ug via INTRAVENOUS

## 2017-06-06 MED ORDER — MEPERIDINE HCL 25 MG/ML IJ SOLN: 6.2500 mg | INTRAMUSCULAR | Status: DC | PRN

## 2017-06-06 MED ORDER — FENTANYL CITRATE (PF) 100 MCG/2ML IJ SOLN
INTRAMUSCULAR | Status: AC
  Filled 2017-06-06: qty 2

## 2017-06-06 MED ORDER — PROPOFOL 10 MG/ML IV BOLUS
INTRAVENOUS | Status: DC | PRN
Start: 2017-06-06 — End: 2017-06-06
  Administered 2017-06-06: 200 mg via INTRAVENOUS

## 2017-06-06 MED ORDER — FENTANYL CITRATE (PF) 100 MCG/2ML IJ SOLN: 50.0000 ug | INTRAMUSCULAR | Status: DC | PRN

## 2017-06-06 MED ORDER — DEXAMETHASONE SODIUM PHOSPHATE 4 MG/ML IJ SOLN
INTRAMUSCULAR | Status: DC | PRN
  Administered 2017-06-06: 10 mg via INTRAVENOUS

## 2017-06-06 MED ORDER — LIDOCAINE HCL (CARDIAC) 20 MG/ML IV SOLN
INTRAVENOUS | Status: DC | PRN
  Administered 2017-06-06: 30 mg via INTRAVENOUS

## 2017-06-06 MED ORDER — ONDANSETRON HCL 4 MG/2ML IJ SOLN
INTRAMUSCULAR | Status: AC
  Filled 2017-06-06: qty 2

## 2017-06-06 MED ORDER — SCOPOLAMINE 1 MG/3DAYS TD PT72: 1.0000 | MEDICATED_PATCH | Freq: Once | TRANSDERMAL | Status: DC | PRN

## 2017-06-06 MED ORDER — MIDAZOLAM HCL 5 MG/5ML IJ SOLN
INTRAMUSCULAR | Status: DC | PRN
  Administered 2017-06-06: 2 mg via INTRAVENOUS

## 2017-06-06 MED ORDER — PROPOFOL 10 MG/ML IV BOLUS
INTRAVENOUS | Status: AC
  Filled 2017-06-06: qty 20

## 2017-06-06 SURGICAL SUPPLY — 29 items
ADAPTER TUBE FLEX ULTRASET (MISCELLANEOUS) ×3 IMPLANT
CANISTER SUCT 1200ML W/VALVE (MISCELLANEOUS) ×3 IMPLANT
CONT SPEC 4OZ CLIKSEAL STRL BL (MISCELLANEOUS) ×3 IMPLANT
GAUZE SPONGE 4X4 12PLY STRL LF (GAUZE/BANDAGES/DRESSINGS) ×6 IMPLANT
GLOVE BIO SURGEON STRL SZ7.5 (GLOVE) ×3 IMPLANT
GLOVE BIOGEL PI IND STRL 7.0 (GLOVE) IMPLANT
GLOVE BIOGEL PI INDICATOR 7.0 (GLOVE) ×2
GOWN STRL REUS W/ TWL LRG LVL3 (GOWN DISPOSABLE) IMPLANT
GOWN STRL REUS W/TWL LRG LVL3 (GOWN DISPOSABLE) ×3
GUARD TEETH (MISCELLANEOUS) IMPLANT
MARKER SKIN DUAL TIP RULER LAB (MISCELLANEOUS) IMPLANT
NDL HYPO 18GX1.5 BLUNT FILL (NEEDLE) ×1 IMPLANT
NDL SPNL 22GX7 QUINCKE BK (NEEDLE) IMPLANT
NDL SPNL 25GX3.5 QUINCKE BL (NEEDLE) ×1 IMPLANT
NEEDLE HYPO 18GX1.5 BLUNT FILL (NEEDLE) ×3 IMPLANT
NEEDLE SPNL 22GX7 QUINCKE BK (NEEDLE) IMPLANT
NEEDLE SPNL 25GX3.5 QUINCKE BL (NEEDLE) IMPLANT
NS IRRIG 1000ML POUR BTL (IV SOLUTION) ×3 IMPLANT
PACK BASIN DAY SURGERY FS (CUSTOM PROCEDURE TRAY) ×1 IMPLANT
PATTIES SURGICAL .5 X3 (DISPOSABLE) ×3 IMPLANT
SHEET MEDIUM DRAPE 40X70 STRL (DRAPES) ×3 IMPLANT
SLEEVE SCD COMPRESS KNEE MED (MISCELLANEOUS) IMPLANT
SOLUTION BUTLER CLEAR DIP (MISCELLANEOUS) ×3 IMPLANT
SURGILUBE 2OZ TUBE FLIPTOP (MISCELLANEOUS) IMPLANT
SYR CONTROL 10ML LL (SYRINGE) ×2 IMPLANT
SYR TB 1ML LL NO SAFETY (SYRINGE) ×1 IMPLANT
TOWEL OR 17X24 6PK STRL BLUE (TOWEL DISPOSABLE) ×3 IMPLANT
TUBE CONNECTING 20'X1/4 (TUBING) ×1
TUBE CONNECTING 20X1/4 (TUBING) ×2 IMPLANT

## 2017-06-06 NOTE — Transfer of Care (Signed)
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

## 2017-06-06 NOTE — Anesthesia Postprocedure Evaluation (Signed)
Anesthesia Post Note  Patient: Edward Erickson  Procedure(s) Performed: MICRO DIRECT LARYNGOSCOPY WITH EXCISION OF VOCAL CORD MASS (Right Throat)     Patient location during evaluation: PACU Anesthesia Type: General Level of consciousness: awake and alert Pain management: pain level controlled Vital Signs Assessment: post-procedure vital signs reviewed and stable Respiratory status: spontaneous breathing, nonlabored ventilation, respiratory function stable and patient connected to nasal cannula oxygen Cardiovascular status: blood pressure returned to baseline and stable Postop Assessment: no apparent nausea or vomiting Anesthetic complications: no    Last Vitals:  Vitals:   06/06/17 1215 06/06/17 1230  BP: 107/72 109/64  Pulse: 74 (!) 58  Resp: 13 14  Temp:    SpO2: 100% 100%    Last Pain:  Vitals:   06/06/17 1230  TempSrc:   PainSc: [name]

## 2017-06-06 NOTE — Op Note (Signed)
DATE OF PROCEDURE:  06/06/2017                              OPERATIVE REPORT  SURGEON:  Leta Baptist, MD  PREOPERATIVE DIAGNOSES: 1. Hoarseness 2. Vocal cord mass  POSTOPERATIVE DIAGNOSES: 1. Hoarseness 2. Right anterior vocal cord mass  PROCEDURE PERFORMED: MicroDirect laryngoscopy with excision of right anterior vocal cord mass (CPT 31541).  ANESTHESIA:  General endotracheal tube anesthesia.  COMPLICATIONS:  None.  ESTIMATED BLOOD LOSS:  Minimal.  INDICATION FOR PROCEDURE:  Edward Erickson is a 51 y.o. male with a history of progressive hoarseness for the past 3 years.  The patient has been experiencing constant hoarseness.  On his fiberoptic laryngoscopy examination, he was noted to have a fungating mass at the anterior commissure of his vocal cords.  He was also noted to have significant posterior laryngeal edema. Based on the above findings, the decision was made for the patient to undergo the above-stated procedure. Likelihood of success in reducing symptoms was also discussed.  The risks, benefits, alternatives, and details of the procedure were discussed with the patient.  Questions were invited and answered.  Informed consent was obtained.  DESCRIPTION:  The patient was taken to the operating room and placed supine on the operating table.  General endotracheal tube anesthesia was administered by the anesthesiologist.  The patient was positioned and prepped and draped in a standard fashion for direct laryngoscopy examination.  A Dedo laryngoscope was used for the examination.  The laryngoscope was inserted via the oral cavity into the pharynx.  Examination of the vallecula, epiglottis, aryepiglottic folds, and piriform sinuses were all normal.  Laryngeal examination revealed a fungating mass at the anterior commissure.  The mass was noted to be originated from the right anterior vocal cord.  Photo documentation of the findings was obtained.  An operating microscope was brought into  the field.  Under the operating microscope, the vocal cord mass was excised using laryngeal forceps and laryngeal scissors.  Hemostasis was achieved with pledgets soaked with epinephrine.  The laryngoscope was withdrawn.  The care of the patient was turned over to the anesthesiologist.  The patient was awakened from anesthesia without difficulty.  The patient was extubated and transferred to the recovery room in good condition.  OPERATIVE FINDINGS:  A 5 mm fungating mass was noted at the right anterior vocal cord.  SPECIMEN: Right vocal cord mass.  FOLLOWUP CARE:  The patient will be discharged home once awake and alert.  The patient will follow-up in my office in approximately 1 week.  Edward Erickson Edward Erickson 06/06/2017 12:29 PM

## 2017-06-06 NOTE — H&P (Signed)
Cc: Hoarseness  HPI: The patient is a 51 y/o male who presents today for evaluation of progressive hoarseness for the past 3 years. The patient is seen in consultation requested by Primary Care at [facility]. The patient noted onset of symptoms over three years ago. He notes constant hoarseness but no loss of voice. The patient denies dysphagia or odynophagia. The patient denies throat pain but feels like his throat is dry. The patient denies reflux symptoms. He has a history of sleep apnea and uses a CPAP. He denies tobacco use. The patient believes he had some kind of throat surgery in the past but is unsure exactly what it was.   The patient's review of systems (constitutional, eyes, ENT, cardiovascular, respiratory, GI, musculoskeletal, skin, neurologic, psychiatric, endocrine, hematologic, allergic) is noted in the ROS questionnaire.  It is reviewed with the patient.   Family health history: Hypertension, OSA.  Major events: None.  Ongoing medical problems: OSA using  CPAP, asthma, hypertension.  Social history: The patient is married. He denies the use of tobacco, alcohol or illegal drugs.   Exam General: Communicates without difficulty, well nourished, no acute distress. Severe hoarseness. Head: Normocephalic, no evidence injury, no tenderness, facial buttresses intact without stepoff. Eyes: PERRL, EOMI.  No scleral icterus, conjunctivae clear. Ears: External auditory canals clear bilaterally.  There is no edema or erythema.  Tympanic membrane is within normal limits bilaterally. Nose: Normal skin and external support.  Anterior rhinoscopy reveals healthy pink mucosa over the septum and turbinates.  No lesions or polyps were seen. Oral cavity: Lips without lesions, oral mucosa moist, no masses or lesions seen. Indirect  mirror laryngoscopy could not be tolerated. Pharynx: Clear, no erythema. Neck: Supple, full range of motion, no lymphadenopathy, no masses palpable. Salivary: Parotid and  submandibular glands without mass. Neuro:  CN 2-12 grossly intact. Gait normal. Vestibular: No nystagmus at any point of gaze.   Procedure:  Flexible Fiberoptic Laryngoscopy -- Risks, benefits, and alternatives of flexible endoscopy were explained to the patient.  Specific mention was made of the risk of throat numbness with difficulty swallowing, possible bleeding from the nose and mouth, and pain from the procedure.  The patient gave oral consent to proceed.  The nasal cavities were decongested and anesthetised with a combination of oxymetazoline and 4% lidocaine solution.  The flexible scope was inserted into the right nasal cavity and advanced towards the nasopharynx.  Visualized mucosa over the turbinates and septum were normal.  The nasopharynx was clear.  Oropharyngeal walls were symmetric and mobile without lesion, mass, or edema.  Hypopharynx was also without  lesion or edema.  Larynx was mobile without lesions.  No lesions or asymmetry in the supraglottic larynx.  Arytenoid mucosa was edematous with slight erythema.  True vocal folds were pale yellow and edematous. A growth is noted at the anterior commissure.  Base of tongue was within normal limits. The patient tolerated the procedure well.   Assessment A growth is noted on the vocal cord at the anterior commissure.  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  No other suspicious mass or lesion is noted on today's fiberoptic laryngoscopy exam.  Plan  1. The laryngoscopy findings are reviewed with the patient at length.  2. Recommend direct laryngoscopy with excision of his vocal cord lesion. The risks, benefits, alternatives, and details of the procedure are reviewed with the patient. Questions are invited and answered. 3. Information on laryngopharyngeal reflux is discussed with the patient. Behavior modifications that  could improve the symptoms are also discussed. 4. Omeprazole 20 mg daily. 5. The  procedure will be scheduled in accordance to the patient's schedule.

## 2017-06-06 NOTE — Discharge Instructions (Addendum)
°  Post Anesthesia Home Care Instructions  Activity: Get plenty of rest for the remainder of the day. A responsible individual must stay with you for 24 hours following the procedure.  For the next 24 hours, DO NOT: -Drive a car -Paediatric nurse -Drink alcoholic beverages -Take any medication unless instructed by your physician -Make any legal decisions or sign important papers.  Meals: Start with liquid foods such as gelatin or soup. Progress to regular foods as tolerated. Avoid greasy, spicy, heavy foods. If nausea and/or vomiting occur, drink only clear liquids until the nausea and/or vomiting subsides. Call your physician if vomiting continues.  Special Instructions/Symptoms: Your throat may feel dry or sore from the anesthesia or the breathing tube placed in your throat during surgery. If this causes discomfort, gargle with warm salt water. The discomfort should disappear within 24 hours.  If you had a scopolamine patch placed behind your ear for the management of post- operative nausea and/or vomiting:  1. The medication in the patch is effective for 72 hours, after which it should be removed.  Wrap patch in a tissue and discard in the trash. Wash hands thoroughly with soap and water. 2. You may remove the patch earlier than 72 hours if you experience unpleasant side effects which may include dry mouth, dizziness or visual disturbances. 3. Avoid touching the patch. Wash your hands with soap and water after contact with the patch.   The patient may resume all his previous activities and diet. He will follow up in my in 1 week as scheduled.

## 2017-06-06 NOTE — Anesthesia Preprocedure Evaluation (Signed)
Anesthesia Evaluation  Patient identified by MRN, date of birth, ID band Patient awake    Reviewed: Allergy & Precautions, NPO status , Patient's Chart, lab work & pertinent test results  Airway Mallampati: II  TM Distance: >3 FB Neck ROM: Full    Dental no notable dental hx.    Pulmonary neg pulmonary ROS, sleep apnea and Continuous Positive Airway Pressure Ventilation ,    Pulmonary exam normal breath sounds clear to auscultation       Cardiovascular hypertension, Pt. on medications and Pt. on home beta blockers negative cardio ROS Normal cardiovascular exam Rhythm:Regular Rate:Normal     Neuro/Psych Depression negative neurological ROS  negative psych ROS   GI/Hepatic negative GI ROS, Neg liver ROS,   Endo/Other  negative endocrine ROS  Renal/GU negative Renal ROS  negative genitourinary   Musculoskeletal negative musculoskeletal ROS (+)   Abdominal   Peds negative pediatric ROS (+)  Hematology negative hematology ROS (+)   Anesthesia Other Findings   Reproductive/Obstetrics negative OB ROS                             Anesthesia Physical Anesthesia Plan  ASA: II  Anesthesia Plan: General   Post-op Pain Management:    Induction: Intravenous  PONV Risk Score and Plan: 2 and Ondansetron, Dexamethasone and Treatment may vary due to age or medical condition  Airway Management Planned: Oral ETT  Additional Equipment:   Intra-op Plan:   Post-operative Plan: Extubation in OR  Informed Consent: I have reviewed the patients History and Physical, chart, labs and discussed the procedure including the risks, benefits and alternatives for the proposed anesthesia with the patient or authorized representative who has indicated his/her understanding and acceptance.     Plan Discussed with:   Anesthesia Plan Comments: (  )        Anesthesia Quick Evaluation

## 2017-06-06 NOTE — Anesthesia Procedure Notes (Signed)
Procedure Name: Intubation Date/Time: 06/06/2017 11:13 AM Performed by: Marrianne Mood, CRNA Pre-anesthesia Checklist: Patient identified, Emergency Drugs available, Suction available, Patient being monitored and Timeout performed Patient Re-evaluated:Patient Re-evaluated prior to induction Oxygen Delivery Method: Circle system utilized Preoxygenation: Pre-oxygenation with 100% oxygen Induction Type: IV induction Ventilation: Mask ventilation without difficulty Laryngoscope Size: Glidescope and 3 Grade View: Grade III Tube type: Oral Tube size: 6.5 mm Number of attempts: 1 Airway Equipment and Method: Stylet and Oral airway Placement Confirmation: ETT inserted through vocal cords under direct vision,  positive ETCO2 and breath sounds checked- equal and bilateral Secured at: 17 cm Tube secured with: Tape Dental Injury: Teeth and Oropharynx as per pre-operative assessment

## 2017-06-07 ENCOUNTER — Encounter (HOSPITAL_BASED_OUTPATIENT_CLINIC_OR_DEPARTMENT_OTHER): Payer: Self-pay | Admitting: Otolaryngology

## 2017-06-07 NOTE — Addendum Note (Signed)
Addendum  created 06/07/17 0935 by Tawni Millers, CRNA   Charge Capture section accepted

## 2017-06-15 ENCOUNTER — Encounter: Payer: Self-pay | Admitting: Physician Assistant

## 2017-06-15 DIAGNOSIS — J383 Other diseases of vocal cords: Secondary | ICD-10-CM | POA: Insufficient documentation

## 2017-07-19 ENCOUNTER — Other Ambulatory Visit: Payer: Self-pay | Admitting: Physician Assistant

## 2017-07-19 DIAGNOSIS — I10 Essential (primary) hypertension: Secondary | ICD-10-CM

## 2017-07-19 DIAGNOSIS — F411 Generalized anxiety disorder: Secondary | ICD-10-CM

## 2017-09-25 ENCOUNTER — Encounter: Payer: Self-pay | Admitting: Physician Assistant

## 2017-10-16 ENCOUNTER — Ambulatory Visit: Payer: Managed Care, Other (non HMO) | Admitting: Physician Assistant

## 2017-10-16 VITALS — BP 128/82 | HR 73 | Temp 97.8°F | Resp 16 | Ht 66.0 in | Wt 220.0 lb

## 2017-10-16 DIAGNOSIS — J302 Other seasonal allergic rhinitis: Secondary | ICD-10-CM

## 2017-10-16 DIAGNOSIS — F411 Generalized anxiety disorder: Secondary | ICD-10-CM | POA: Diagnosis not present

## 2017-10-16 DIAGNOSIS — I10 Essential (primary) hypertension: Secondary | ICD-10-CM | POA: Diagnosis not present

## 2017-10-16 DIAGNOSIS — E782 Mixed hyperlipidemia: Secondary | ICD-10-CM | POA: Diagnosis not present

## 2017-10-16 MED ORDER — POTASSIUM CHLORIDE CRYS ER 20 MEQ PO TBCR: 20.0000 meq | EXTENDED_RELEASE_TABLET | Freq: Every day | ORAL | 1 refills | Status: DC

## 2017-10-16 MED ORDER — AMLODIPINE BESYLATE 10 MG PO TABS: ORAL_TABLET | ORAL | 1 refills | Status: DC

## 2017-10-16 MED ORDER — CETIRIZINE HCL 10 MG PO TABS: 10.0000 mg | ORAL_TABLET | Freq: Every day | ORAL | 11 refills | Status: DC

## 2017-10-16 MED ORDER — METOPROLOL TARTRATE 50 MG PO TABS: ORAL_TABLET | ORAL | 1 refills | Status: DC

## 2017-10-16 MED ORDER — LOSARTAN POTASSIUM-HCTZ 50-12.5 MG PO TABS: 1.0000 | ORAL_TABLET | Freq: Every day | ORAL | 1 refills | Status: DC

## 2017-10-16 MED ORDER — OLOPATADINE HCL 0.2 % OP SOLN
1.0000 [drp] | Freq: Every day | OPHTHALMIC | 2 refills | Status: DC
Start: 2017-10-16 — End: 2018-01-11

## 2017-10-16 MED ORDER — SERTRALINE HCL 50 MG PO TABS: ORAL_TABLET | ORAL | 1 refills | Status: DC

## 2017-10-16 NOTE — Patient Instructions (Addendum)
DASH Eating Plan DASH stands for "Dietary Approaches to Stop Hypertension." The DASH eating plan is a healthy eating plan that has been shown to reduce high blood pressure (hypertension). It may also reduce your risk for type 2 diabetes, heart disease, and stroke. The DASH eating plan may also help with weight loss. What are tips for following this plan? General guidelines  Avoid eating more than 2,300 mg (milligrams) of salt (sodium) a day. If you have hypertension, you may need to reduce your sodium intake to 1,500 mg a day.  Limit alcohol intake to no more than 1 drink a day for nonpregnant women and 2 drinks a day for men. One drink equals 12 oz of beer, 5 oz of wine, or 1 oz of hard liquor.  Work with your health care provider to maintain a healthy body weight or to lose weight. Ask what an ideal weight is for you.  Get at least 30 minutes of exercise that causes your heart to beat faster (aerobic exercise) most days of the week. Activities may include walking, swimming, or biking.  Work with your health care provider or diet and nutrition specialist (dietitian) to adjust your eating plan to your individual calorie needs. Reading food labels  Check food labels for the amount of sodium per serving. Choose foods with less than 5 percent of the Daily Value of sodium. Generally, foods with less than 300 mg of sodium per serving fit into this eating plan.  To find whole grains, look for the word "whole" as the first word in the ingredient list. Shopping  Buy products labeled as "low-sodium" or "no salt added."  Buy fresh foods. Avoid canned foods and premade or frozen meals. Cooking  Avoid adding salt when cooking. Use salt-free seasonings or herbs instead of table salt or sea salt. Check with your health care provider or pharmacist before using salt substitutes.  Do not fry foods. Cook foods using healthy methods such as baking, boiling, grilling, and broiling instead.  Cook with  heart-healthy oils, such as olive, canola, soybean, or sunflower oil. Meal planning   Eat a balanced diet that includes: ? 5 or more servings of fruits and vegetables each day. At each meal, try to fill half of your plate with fruits and vegetables. ? Up to 6-8 servings of whole grains each day. ? Less than 6 oz of lean meat, poultry, or fish each day. A 3-oz serving of meat is about the same size as a deck of cards. One egg equals 1 oz. ? 2 servings of low-fat dairy each day. ? A serving of nuts, seeds, or beans 5 times each week. ? Heart-healthy fats. Healthy fats called Omega-3 fatty acids are found in foods such as flaxseeds and coldwater fish, like sardines, salmon, and mackerel.  Limit how much you eat of the following: ? Canned or prepackaged foods. ? Food that is high in trans fat, such as fried foods. ? Food that is high in saturated fat, such as fatty meat. ? Sweets, desserts, sugary drinks, and other foods with added sugar. ? Full-fat dairy products.  Do not salt foods before eating.  Try to eat at least 2 vegetarian meals each week.  Eat more home-cooked food and less restaurant, buffet, and fast food.  When eating at a restaurant, ask that your food be prepared with less salt or no salt, if possible. What foods are recommended? The items listed may not be a complete list. Talk with your dietitian about  what dietary choices are best for you. Grains Whole-grain or whole-wheat bread. Whole-grain or whole-wheat pasta. Brown rice. Oatmeal. Quinoa. Bulgur. Whole-grain and low-sodium cereals. Pita bread. Low-fat, low-sodium crackers. Whole-wheat flour tortillas. Vegetables Fresh or frozen vegetables (raw, steamed, roasted, or grilled). Low-sodium or reduced-sodium tomato and vegetable juice. Low-sodium or reduced-sodium tomato sauce and tomato paste. Low-sodium or reduced-sodium canned vegetables. Fruits All fresh, dried, or frozen fruit. Canned fruit in natural juice (without  added sugar). Meat and other protein foods Skinless chicken or turkey. Ground chicken or turkey. Pork with fat trimmed off. Fish and seafood. Egg whites. Dried beans, peas, or lentils. Unsalted nuts, nut butters, and seeds. Unsalted canned beans. Lean cuts of beef with fat trimmed off. Low-sodium, lean deli meat. Dairy Low-fat (1%) or fat-free (skim) milk. Fat-free, low-fat, or reduced-fat cheeses. Nonfat, low-sodium ricotta or cottage cheese. Low-fat or nonfat yogurt. Low-fat, low-sodium cheese. Fats and oils Soft margarine without trans fats. Vegetable oil. Low-fat, reduced-fat, or light mayonnaise and salad dressings (reduced-sodium). Canola, safflower, olive, soybean, and sunflower oils. Avocado. Seasoning and other foods Herbs. Spices. Seasoning mixes without salt. Unsalted popcorn and pretzels. Fat-free sweets. What foods are not recommended? The items listed may not be a complete list. Talk with your dietitian about what dietary choices are best for you. Grains Baked goods made with fat, such as croissants, muffins, or some breads. Dry pasta or rice meal packs. Vegetables Creamed or fried vegetables. Vegetables in a cheese sauce. Regular canned vegetables (not low-sodium or reduced-sodium). Regular canned tomato sauce and paste (not low-sodium or reduced-sodium). Regular tomato and vegetable juice (not low-sodium or reduced-sodium). Pickles. Olives. Fruits Canned fruit in a light or heavy syrup. Fried fruit. Fruit in cream or butter sauce. Meat and other protein foods Fatty cuts of meat. Ribs. Fried meat. Bacon. Sausage. Bologna and other processed lunch meats. Salami. Fatback. Hotdogs. Bratwurst. Salted nuts and seeds. Canned beans with added salt. Canned or smoked fish. Whole eggs or egg yolks. Chicken or turkey with skin. Dairy Whole or 2% milk, cream, and half-and-half. Whole or full-fat cream cheese. Whole-fat or sweetened yogurt. Full-fat cheese. Nondairy creamers. Whipped toppings.  Processed cheese and cheese spreads. Fats and oils Butter. Stick margarine. Lard. Shortening. Ghee. Bacon fat. Tropical oils, such as coconut, palm kernel, or palm oil. Seasoning and other foods Salted popcorn and pretzels. Onion salt, garlic salt, seasoned salt, table salt, and sea salt. Worcestershire sauce. Tartar sauce. Barbecue sauce. Teriyaki sauce. Soy sauce, including reduced-sodium. Steak sauce. Canned and packaged gravies. Fish sauce. Oyster sauce. Cocktail sauce. Horseradish that you find on the shelf. Ketchup. Mustard. Meat flavorings and tenderizers. Bouillon cubes. Hot sauce and Tabasco sauce. Premade or packaged marinades. Premade or packaged taco seasonings. Relishes. Regular salad dressings. Where to find more information:  National Heart, Lung, and Blood Institute: www.nhlbi.nih.gov  American Heart Association: www.heart.org Summary  The DASH eating plan is a healthy eating plan that has been shown to reduce high blood pressure (hypertension). It may also reduce your risk for type 2 diabetes, heart disease, and stroke.  With the DASH eating plan, you should limit salt (sodium) intake to 2,300 mg a day. If you have hypertension, you may need to reduce your sodium intake to 1,500 mg a day.  When on the DASH eating plan, aim to eat more fresh fruits and vegetables, whole grains, lean proteins, low-fat dairy, and heart-healthy fats.  Work with your health care provider or diet and nutrition specialist (dietitian) to adjust your eating plan to your   individual calorie needs. This information is not intended to replace advice given to you by your health care provider. Make sure you discuss any questions you have with your health care provider. Document Released: 06/02/2011 Document Revised: 06/06/2016 Document Reviewed: 06/06/2016 Elsevier Interactive Patient Education  2018 Elsevier Inc.  Conjuntivitis alrgica (Allergic Conjunctivitis) Lilian Coma delgada y transparente  (conjuntiva) cubre la parte blanca del ojo y la superficie interna del prpado. La conjuntivitis alrgica se produce cuando esta membrana se irrita, lo que es consecuencia de las [place]. Entre las cosas comunes (alrgenos) que pueden causar una reaccin alrgica, se incluyen las siguientes:  Polvo.  Polen.  Moho.  Animales: ? El pelo. ? El pelaje. ? La piel. ? La saliva u otros lquidos de los [place]. Esta afeccin puede hacer que los ojos tengan un color rojo o rosa. Tambin puede causar [employer] ojos. Esta afeccin no se transmite de [place] persona a la otra (no contagiosa). [place] o aplquese los medicamentos solamente como se lo haya indicado el mdico.  Evite tocarse o frotarse los ojos.  Aplquese un pao limpio y fro en el ojo durante 10a 21minutos, 3 o 4veces al [employer].  Si [place] lentes de contacto, no las use hasta que la irritacin se haya ido. Mientras tanto, use anteojos.  Evite usar [employer] ojos hasta que la irritacin se haya ido.  Trate de evitar el alrgeno que le est causando la Risk analyst.  SOLICITE AYUDA SI:  Los sntomas empeoran.  Le supura pus de los ojos.  Aparecen nuevos sntomas.  Tiene fiebre.  Esta informacin no tiene Marine scientist el consejo del mdico. Asegrese de hacerle al mdico cualquier pregunta que tenga. Document Released: 06/02/2011 Document Revised: 07/04/2014 Document Reviewed: 03/25/2014 Elsevier Interactive Patient Education  2017 [employer].    IF you received an x-ray today, you will receive an invoice from [facility]. Please contact [facility] at [phone] with questions or concerns regarding your invoice.   IF you received labwork today, you will receive an invoice from [facility]. Please contact [facility] at [phone] with questions or concerns regarding your invoice.   Our billing staff will not be able to assist you with questions regarding  bills from these companies.  You will be contacted with the lab results as soon as they are available. The fastest way to get your results is to activate your My Chart account. Instructions are located on the last page of this paperwork. If you have not heard from [place] regarding the results in 2 weeks, please contact this office.

## 2017-10-16 NOTE — [place] Notes (Signed)
PRIMARY CARE AT [place], [place] [address] [phone]  Date:  10/16/2017   Name:  Edward Erickson   DOB:  Oct 14, 1965   MRN:  [id number]  PCP:  Patient, No Pcp Per    History of Present Illness:  Edward Erickson is a 51 y.o. male patient who presents to PCP with  Chief Complaint  Patient presents with  . Allergies    pt eyes are watering and red/ x 1 wk.  pt would like refill of flonase     Patient has had more than 1 week of water eyes.   He is sneezing He has congestion and runny nose. He has had the left eye with crust at the end. He has had similar symptoms occur last year. He is not taking anything for his symptoms at this time.    He would also like refill of bp medications.  He is taking compliantly.  No side effects to meds.  No cp, palpitations, sob, or leg swellings.   He is eating everything he wants.  Weight gain present. Wt Readings from Last 3 Encounters:  10/16/17 220 lb (99.8 kg)  06/06/17 207 lb 4 oz (94 kg)  04/27/17 208 lb (94.3 kg)     Anxiety is better controlled with zoloft.  This is stable.      Patient Active Problem List   Diagnosis Date Noted  . 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 OSA on CPAP 07/30/2013    Past Medical History:  Diagnosis Date  . Apnea   . Asthma   . Hyperlipidemia    mild  . Hypertension   . OSA on CPAP 07/30/2013  . Snoring     Past Surgical History:  Procedure Laterality Date  . DIRECT LARYNGOSCOPY Right 06/06/2017   Procedure: MICRO DIRECT LARYNGOSCOPY WITH EXCISION OF VOCAL CORD MASS;  Surgeon: Leta Baptist, MD;  Location: [facility];  Service: ENT;  Laterality: Right;  . THROAT SURGERY     approx five years ago unsure    Social History   Tobacco Use  . Smoking status: Never Smoker  . Smokeless tobacco: Never Used  Substance Use Topics  . Alcohol use: Yes  . Drug use: No    Family History  Problem Relation Age of Onset  . Hypertension Mother   . Hypertension Father    . Snoring Brother        all 3 brothers  . Snoring Sister        all 3 sisters    No Known Allergies  Medication list has been reviewed and updated.  Current Outpatient Medications on File Prior to Visit  Medication Sig Dispense Refill  . amLODipine (NORVASC) 10 MG tablet TAKE 1 TABLET(10 MG) BY MOUTH DAILY 90 tablet 0  . atorvastatin (LIPITOR) 20 MG tablet TAKE 1 TABLET(20 MG) BY MOUTH EVERY EVENING WITH DINNER 90 tablet 1  . diclofenac (VOLTAREN) 75 MG EC tablet TAKE 1 TABLET(75 MG) BY MOUTH TWICE DAILY 60 tablet 0  . fluticasone (FLONASE) 50 MCG/ACT nasal spray Place 1 spray into both nostrils at bedtime. 16 g 5  . losartan-hydrochlorothiazide (HYZAAR) 50-12.5 MG tablet Take 1 tablet by mouth daily. Follow up due Sept 2018 90 tablet 1  . metoprolol tartrate (LOPRESSOR) 50 MG tablet TAKE 1 TABLET(50 MG) BY MOUTH TWICE DAILY 180 tablet 0  . potassium chloride SA (K-DUR,KLOR-CON) 20 MEQ tablet Take 1 tablet (20 mEq total) by mouth daily. 90 tablet 1  .  predniSONE (DELTASONE) 20 MG tablet Take 3 PO QAM x2days, 2 PO QAM x2days, 1 PO QAM x3days 13 tablet 0  . sertraline (ZOLOFT) 50 MG tablet TAKE 1 TABLET(50 MG) BY MOUTH DAILY 90 tablet 0   No current facility-administered medications on file prior to visit.     ROS ROS otherwise unremarkable unless listed above.  Physical Examination: BP 128/82   Pulse 73   Temp 97.8 F (36.6 C) (Oral)   Resp 16   Ht _0  (1.676 m)   Wt 220 lb (99.8 kg)   SpO2 96%   BMI 35.51 kg/m  Ideal Body Weight: Weight in (lb) to have BMI = 25: 154.6 Wt Readings from Last 3 Encounters:  10/16/17 220 lb (99.8 kg)  06/06/17 207 lb 4 oz (94 kg)  04/27/17 208 lb (94.3 kg)    Physical Exam  Constitutional: He is oriented to person, place, and time. He appears well-developed and well-nourished. No distress.  HENT:  Head: Normocephalic and atraumatic.  Right Ear: Tympanic membrane, external ear and ear canal normal.  Left Ear: Tympanic membrane,  external ear and ear canal normal.  Nose: Mucosal edema and rhinorrhea present. Right sinus exhibits no maxillary sinus tenderness and no frontal sinus tenderness. Left sinus exhibits no maxillary sinus tenderness and no frontal sinus tenderness.  Mouth/Throat: No uvula swelling. No oropharyngeal exudate, posterior oropharyngeal edema or posterior oropharyngeal erythema.  Eyes: Pupils are equal, round, and reactive to light. Conjunctivae, EOM and lids are normal. Right eye exhibits normal extraocular motion. Left eye exhibits normal extraocular motion.  Neck: Trachea normal and full passive range of motion without pain. No edema and no erythema present.  Cardiovascular: Normal rate.  Pulmonary/Chest: Effort normal. No respiratory distress. He has no decreased breath sounds. He has no wheezes. He has no rhonchi.  Neurological: He is alert and oriented to person, place, and time.  Skin: Skin is warm and dry. He is not diaphoretic.  Psychiatric: He has a normal mood and affect. His behavior is normal.     Assessment and Plan: Edward Erickson is a 52 y.o. male who is here today for  Chief Complaint  Patient presents with  . Allergies    pt eyes are watering and red/ x 1 wk.  pt would like refill of flonase   Allergy meds discussed.   Advised to return for fasting lipid panel. Refills given Rtc bp 6 months Rtc in 1 year for zoloft.  Seasonal allergies - Plan: cetirizine (ZYRTEC) 10 MG tablet, Olopatadine HCl 0.2 % SOLN  Essential hypertension - Plan: CMP14+EGFR, potassium chloride SA (K-DUR,KLOR-CON) 20 MEQ tablet, metoprolol tartrate (LOPRESSOR) 50 MG tablet, losartan-hydrochlorothiazide (HYZAAR) 50-12.5 MG tablet, amLODipine (NORVASC) 10 MG tablet  Generalized anxiety disorder - Plan: sertraline (ZOLOFT) 50 MG tablet  Mixed hyperlipidemia - Plan: Lipid panel  Ivar Drape, PA-C [facility] 4/24/20197:55 AM

## 2017-10-17 ENCOUNTER — Other Ambulatory Visit: Payer: Self-pay | Admitting: Physician Assistant

## 2017-10-17 DIAGNOSIS — I10 Essential (primary) hypertension: Secondary | ICD-10-CM

## 2017-10-17 DIAGNOSIS — F411 Generalized anxiety disorder: Secondary | ICD-10-CM

## 2017-10-17 LAB — CMP14+EGFR
ALT: 20 IU/L (ref 0–44)
AST: 24 IU/L (ref 0–40)
Albumin/Globulin Ratio: 1 — ABNORMAL LOW (ref 1.2–2.2)
Albumin: 3.8 g/dL (ref 3.5–5.5)
Alkaline Phosphatase: 120 IU/L — ABNORMAL HIGH (ref 39–117)
BUN/Creatinine Ratio: 14 (ref 9–20)
BUN: 14 mg/dL (ref 6–24)
Bilirubin Total: 0.2 mg/dL (ref 0.0–1.2)
CALCIUM: 9 mg/dL (ref 8.7–10.2)
CO2: 25 mmol/L (ref 20–29)
CREATININE: 0.97 mg/dL (ref 0.76–1.27)
Chloride: 102 mmol/L (ref 96–106)
GFR, EST AFRICAN AMERICAN: 104 mL/min/{1.73_m2} (ref >59)
GFR, EST NON AFRICAN AMERICAN: 90 mL/min/{1.73_m2} (ref >59)
Globulin, Total: 3.8 g/dL (ref 1.5–4.5)
Glucose: 109 mg/dL — ABNORMAL HIGH (ref 65–99)
POTASSIUM: 3.7 mmol/L (ref 3.5–5.2)
Sodium: 141 mmol/L (ref 134–144)
TOTAL PROTEIN: 7.6 g/dL (ref 6.0–8.5)

## 2017-10-18 ENCOUNTER — Encounter: Payer: Self-pay | Admitting: *Deleted

## 2017-10-18 ENCOUNTER — Encounter: Payer: Self-pay | Admitting: Physician Assistant

## 2017-12-26 ENCOUNTER — Telehealth: Payer: Self-pay | Admitting: Family Medicine

## 2017-12-26 NOTE — Telephone Encounter (Signed)
**  tried to call pt to reschedule this appt as Dr. Nolon Rod will be out of the office this day**No VM - NO DPR**  If pt calls back, please reschedule them for a CPE at their convenience.   Thanks!

## 2018-01-11 ENCOUNTER — Other Ambulatory Visit: Payer: Self-pay

## 2018-01-11 ENCOUNTER — Encounter: Payer: Self-pay | Admitting: Family Medicine

## 2018-01-11 ENCOUNTER — Ambulatory Visit (INDEPENDENT_AMBULATORY_CARE_PROVIDER_SITE_OTHER): Payer: Managed Care, Other (non HMO) | Admitting: Family Medicine

## 2018-01-11 VITALS — BP 114/80 | HR 65 | Temp 98.6°F | Resp 17 | Ht 66.0 in | Wt 217.4 lb

## 2018-01-11 DIAGNOSIS — Z0000 Encounter for general adult medical examination without abnormal findings: Secondary | ICD-10-CM | POA: Diagnosis not present

## 2018-01-11 DIAGNOSIS — Z1211 Encounter for screening for malignant neoplasm of colon: Secondary | ICD-10-CM | POA: Diagnosis not present

## 2018-01-11 DIAGNOSIS — I10 Essential (primary) hypertension: Secondary | ICD-10-CM

## 2018-01-11 DIAGNOSIS — R7303 Prediabetes: Secondary | ICD-10-CM

## 2018-01-11 DIAGNOSIS — Z13220 Encounter for screening for lipoid disorders: Secondary | ICD-10-CM

## 2018-01-11 DIAGNOSIS — F411 Generalized anxiety disorder: Secondary | ICD-10-CM

## 2018-01-11 DIAGNOSIS — Z125 Encounter for screening for malignant neoplasm of prostate: Secondary | ICD-10-CM

## 2018-01-11 DIAGNOSIS — Z131 Encounter for screening for diabetes mellitus: Secondary | ICD-10-CM | POA: Diagnosis not present

## 2018-01-11 DIAGNOSIS — Z113 Encounter for screening for infections with a predominantly sexual mode of transmission: Secondary | ICD-10-CM

## 2018-01-11 DIAGNOSIS — M1712 Unilateral primary osteoarthritis, left knee: Secondary | ICD-10-CM

## 2018-01-11 MED ORDER — AMLODIPINE BESYLATE 10 MG PO TABS: ORAL_TABLET | ORAL | 1 refills | Status: DC

## 2018-01-11 MED ORDER — LOSARTAN POTASSIUM-HCTZ 50-12.5 MG PO TABS: 1.0000 | ORAL_TABLET | Freq: Every day | ORAL | 1 refills | Status: DC

## 2018-01-11 MED ORDER — METOPROLOL TARTRATE 50 MG PO TABS: ORAL_TABLET | ORAL | 1 refills | Status: DC

## 2018-01-11 MED ORDER — SERTRALINE HCL 50 MG PO TABS: ORAL_TABLET | ORAL | 1 refills | Status: DC

## 2018-01-11 NOTE — Progress Notes (Addendum)
Chief Complaint  Patient presents with  . Annual Exam    cpe  . Medication Refill    amlodipine, losartan potassium-hctz, metoprolol tartrate, sertraline    Subjective:  Edward Erickson is a 52 y.o. male here for a health maintenance visit.  Patient is established  Pt  Colon Cancer Screening He has never had a colonoscopy He denies blood in his stool, unexpected weight loss or pain with defecation No rectal itching He does not smoke He does not have a family history of colon cancer  Left knee pain Pt was referred to [facility] He states that he does not like the group and would like to go somewhere else He had knee injections that were painful His knee is painful at the lateral side He states that he cannot run without pain in the left knee No swelling No trauma to the knee  Patient Active Problem List   Diagnosis Date Noted  . 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 OSA on CPAP 07/30/2013    Past Medical History:  Diagnosis Date  . Apnea   . Asthma   . Hyperlipidemia    mild  . Hypertension   . OSA on CPAP 07/30/2013  . Snoring     Past Surgical History:  Procedure Laterality Date  . DIRECT LARYNGOSCOPY Right 06/06/2017   Procedure: MICRO DIRECT LARYNGOSCOPY WITH EXCISION OF VOCAL CORD MASS;  Surgeon: Leta Baptist, MD;  Location: [facility];  Service: ENT;  Laterality: Right;  . THROAT SURGERY     approx five years ago unsure     Outpatient Medications Prior to Visit  Medication Sig Dispense Refill  . amLODipine (NORVASC) 10 MG tablet TAKE 1 TABLET(10 MG) BY MOUTH DAILY 90 tablet 1  . losartan-hydrochlorothiazide (HYZAAR) 50-12.5 MG tablet Take 1 tablet by mouth daily. Follow up due Sept 2018 90 tablet 1  . metoprolol tartrate (LOPRESSOR) 50 MG tablet TAKE 1 TABLET(50 MG) BY MOUTH TWICE DAILY 180 tablet 1  . sertraline (ZOLOFT) 50 MG tablet TAKE 1 TABLET(50 MG) BY MOUTH DAILY 90 tablet 1  . cetirizine (ZYRTEC) 10 MG tablet Take 1 tablet (10 mg  total) by mouth daily. (Patient not taking: Reported on 01/11/2018) 30 tablet 11  . atorvastatin (LIPITOR) 20 MG tablet TAKE 1 TABLET(20 MG) BY MOUTH EVERY EVENING WITH DINNER (Patient not taking: Reported on 01/11/2018) 90 tablet 1  . diclofenac (VOLTAREN) 75 MG EC tablet TAKE 1 TABLET(75 MG) BY MOUTH TWICE DAILY (Patient not taking: Reported on 01/11/2018) 60 tablet 0  . fluticasone (FLONASE) 50 MCG/ACT nasal spray Place 1 spray into both nostrils at bedtime. (Patient not taking: Reported on 01/11/2018) 16 g 5  . Olopatadine HCl 0.2 % SOLN Apply 1 drop to eye daily. (Patient not taking: Reported on 01/11/2018) 2.5 mL 2  . potassium chloride SA (K-DUR,KLOR-CON) 20 MEQ tablet Take 1 tablet (20 mEq total) by mouth daily. (Patient not taking: Reported on 01/11/2018) 90 tablet 1  . predniSONE (DELTASONE) 20 MG tablet Take 3 PO QAM x2days, 2 PO QAM x2days, 1 PO QAM x3days (Patient not taking: Reported on 01/11/2018) 13 tablet 0   No facility-administered medications prior to visit.     No Known Allergies   Family History  Problem Relation Age of Onset  . Hypertension Mother   . Hypertension Father   . Snoring Brother        all 3 brothers  . Snoring Sister        all  3 sisters     Health Habits: Exercise: 2 times/week on average Current exercise activities: walking/running Diet: balanced   Social History   Socioeconomic History  . Marital status: Legally Separated    Spouse name: Not on file  . Number of children: Not on file  . Years of education: Not on file  . Highest education level: Not on file  Occupational History  . Not on file  Social Needs  . Financial resource strain: Not on file  . Food insecurity:    Worry: Not on file    Inability: Not on file  . Transportation needs:    Medical: Not on file    Non-medical: Not on file  Tobacco Use  . Smoking status: Never Smoker  . Smokeless tobacco: Never Used  Substance and Sexual Activity  . Alcohol use: Yes  . Drug use: No   . Sexual activity: Not on file  Lifestyle  . Physical activity:    Days per week: Not on file    Minutes per session: Not on file  . Stress: Not on file  Relationships  . Social connections:    Talks on phone: Not on file    Gets together: Not on file    Attends religious service: Not on file    Active member of club or organization: Not on file    Attends meetings of clubs or organizations: Not on file    Relationship status: Not on file  . Intimate partner violence:    Fear of current or ex partner: Not on file    Emotionally abused: Not on file    Physically abused: Not on file    Forced sexual activity: Not on file  Other Topics Concern  . Not on file  Social History Narrative  . Not on file   Social History   Substance and Sexual Activity  Alcohol Use Yes   Social History   Tobacco Use  Smoking Status Never Smoker  Smokeless Tobacco Never Used   Social History   Substance and Sexual Activity  Drug Use No    Health Maintenance: See under health Maintenance activity for review of completion dates as well. Immunization History  Administered Date(s) Administered  . Influenza-Unspecified 05/08/2012, 01/25/2013  . Pneumococcal Polysaccharide-23 24-Dec-1965  . Tdap 07/29/2011      Depression Screen-PHQ2/9 Depression screen [facility] 2/9 01/11/2018 01/11/2018 10/16/2017 01/30/2017 01/09/2017  Decreased Interest 3 0 0 0 0  Down, Depressed, Hopeless 3 0 0 0 0  PHQ - 2 Score 6 0 0 0 0  Altered sleeping 0 - - - -  Tired, decreased energy 0 - - - -  Change in appetite 1 - - - -  Feeling bad or failure about yourself  1 - - - -  Trouble concentrating 0 - - - -  Moving slowly or fidgety/restless 2 - - - -  Suicidal thoughts 0 - - - -  PHQ-9 Score 10 - - - -  Difficult doing work/chores Somewhat difficult - - - -       Depression Severity and Treatment Recommendations:  0-4= None  5-9= Mild / Treatment: Support, educate to call if worse; return in one month  10-14=  Moderate / Treatment: Support, watchful waiting; Antidepressant or Psycotherapy  15-19= Moderately severe / Treatment: Antidepressant OR Psychotherapy  >= 20 = Major depression, severe / Antidepressant AND Psychotherapy    Review of Systems   Review of Systems  Constitutional: Negative for chills and fever.  HENT: Negative for ear discharge, ear pain and tinnitus.   Respiratory: Negative for cough, shortness of breath and wheezing.   Cardiovascular: Negative for chest pain, palpitations and orthopnea.  Gastrointestinal: Positive for constipation. Negative for abdominal pain, blood in stool, melena, nausea and vomiting.  Genitourinary: Negative for dysuria, flank pain, frequency, hematuria and urgency.  Skin: Negative for itching and rash.  Neurological: Negative for dizziness, tingling, tremors and headaches.  Psychiatric/Behavioral: Negative for depression. The patient is not nervous/anxious.        Objective:   Vitals:   01/11/18 0919  BP: 114/80  Pulse: 65  Resp: 17  Temp: 98.6 F (37 C)  TempSrc: Oral  SpO2: 96%  Weight: 217 lb 6.4 oz (98.6 kg)  Height: 5' 6"  (1.676 m)    Body mass index is 35.09 kg/m.  Physical Exam  BP 114/80 (BP Location: Right Arm, Patient Position: Sitting, Cuff Size: Large)   Pulse 65   Temp 98.6 F (37 C) (Oral)   Resp 17   Ht 5' 6"  (1.676 m)   Wt 217 lb 6.4 oz (98.6 kg)   SpO2 96%   BMI 35.09 kg/m   General Appearance:    Alert, cooperative, no distress, appears stated age  Head:    Normocephalic, without obvious abnormality, atraumatic  Eyes:    PERRL, conjunctiva/corneas clear, EOM's intact, fundi    benign, both eyes       Ears:    Normal TM's and external ear canals, both ears  Nose:   Nares normal, septum midline, mucosa normal, no drainage   or sinus tenderness  Throat:   Lips, mucosa, and tongue normal; teeth and gums normal  Neck:   Supple, symmetrical, trachea midline, no adenopathy;       thyroid:  No  enlargement/tenderness/nodules;   Lungs:     Clear to auscultation bilaterally, respirations unlabored  Chest wall:    No tenderness or deformity  Heart:    Regular rate and rhythm, S1 and S2 normal, no murmur, rub   or gallop  Abdomen:     Soft, non-tender, bowel sounds active all four quadrants,    no masses, no organomegaly  Rectal:    Normal tone, normal prostate, no masses or tenderness;  Extremities:   Extremities normal, atraumatic, no cyanosis or edema Left knee tender to palpation at the lateral joint line, no effusion  Pulses:   2+ and symmetric all extremities  Skin:   Skin color, texture, turgor normal, no rashes or lesions  Lymph nodes:   Cervical, supraclavicular, and axillary nodes normal  Neurologic:   CNII-XII intact. Normal strength, sensation and reflexes      throughout      Assessment/Plan:   Patient was seen for a health maintenance exam.  Counseled the patient on health maintenance issues. Reviewed her health mainteance schedule and ordered appropriate tests (see orders.) Counseled on regular exercise and weight management. Recommend regular eye exams and dental cleaning.   The following issues were addressed today for health maintenance:   Mallie was seen today for annual exam and medication refill.  Diagnoses and all orders for this visit:  Encounter for health maintenance examination in adult -  Discussed health maintenance based on age 10 cancer screening for prostate and colon cancer  Encounter for screening colonoscopy- discussed colon cancer screening Handout provided -     Ambulatory referral to Gastroenterology  Screening for diabetes mellitus (DM) -     Comprehensive metabolic panel -  Hemoglobin A1c  Screening cholesterol level -     Lipid panel -     Comprehensive metabolic panel  Screening for malignant neoplasm of prostate -     PSA  Essential hypertension- Patient's blood pressure is at goal of 139/89 or less. Condition  is stable. Continue current medications and treatment plan. I recommend that you exercise for 30-45 minutes 5 days a week. I also recommend a balanced diet with fruits and vegetables every day, lean meats, and little fried foods. The DASH diet (you can find this online) is a good example of this.  -     losartan-hydrochlorothiazide (HYZAAR) 50-12.5 MG tablet; Take 1 tablet by mouth daily. Follow up due January 2019. -     amLODipine (NORVASC) 10 MG tablet; TAKE 1 TABLET(10 MG) BY MOUTH DAILY -     metoprolol tartrate (LOPRESSOR) 50 MG tablet; TAKE 1 TABLET(50 MG) BY MOUTH TWICE DAILY -     Microalbumin, urine  Generalized anxiety disorder- stable, continue zoloft -     sertraline (ZOLOFT) 50 MG tablet; TAKE 1 TABLET(50 MG) BY MOUTH DAILY  Screen for STD (sexually transmitted disease) -     HIV antibody -     RPR -     Hepatitis B surface antigen -     GC/Chlamydia Probe Amp  Knee Osteoarthritis - left See orders Referral placed for Orthopedics for evaluation and treatment  Return in about 6 months (around 07/14/2018) for hypertension.    Body mass index is 35.09 kg/m.:  Discussed the patient's BMI with patient. The BMI body mass index is 35.09 kg/m.     No future appointments.  Patient Instructions       IF you received an x-ray today, you will receive an invoice from [facility]. Please contact [facility] at [phone] with questions or concerns regarding your invoice.   IF you received labwork today, you will receive an invoice from [facility]. Please contact [facility] at [phone] with questions or concerns regarding your invoice.   Our billing staff will not be able to assist you with questions regarding bills from these companies.  You will be contacted with the lab results as soon as they are available. The fastest way to get your results is to activate your My Chart account. Instructions are located on the last page of this paperwork. If you  have not heard from [place] regarding the results in 2 weeks, please contact this office.     Cancer Screening for Men A cancer screening is a test or exam that checks for cancer. Your health care provider will recommend specific cancer screenings based on your age, personal history, and family history of cancer. Work with your health care provider to create a cancer screening schedule that protects your health. Why is cancer screening done? Cancer screening is done to look for cancer in the very early stages, before it spreads and becomes harder to treat and before you would start to notice symptoms. Finding cancer early improves the chances of successful treatment. It may save your life. Who should be screened for cancer? All men should be screened for colorectal cancer and skin cancer. Your health care provider may recommend screenings for other types of cancer if:  You had cancer before.  You have a family member with cancer.  You have abnormal genes that could increase the risk of cancer.  You have risk factors for certain cancers, such as smoking.  When you should be screened for cancer depends  on:  Your age.  Your medical history and your family's medical history.  Certain lifestyle factors, such as smoking.  Environmental exposure, such as to asbestos.  What are some common cancer screenings? Lung cancer Lung cancer screening is done with a CT scan that looks for abnormal cells in the lungs. Discuss lung cancer screening with your health care provider if you are 26-79 years old and if any of the following apply to you:  You currently smoke.  You used to smoke heavily.  You have had at least a 30-pack-year smoking history.  You have quit smoking within the past 15 years.  If you smoke heavily or if you used to smoke, you may need to be screened every year. Prostate cancer Prostate cancer screening is done with blood tests and an exam in which a health care provider uses a  gloved finger to check prostate size (digital rectal exam). You may need to be screened for prostate cancer if:  You have risk factors of prostate cancer, such as being African American or having a close family member with prostate cancer.  You have inherited gene changes or a genetic condition, including BRCA1 or BRCA2 gene mutations or Lynch syndrome.  You have symptoms of prostate cancer, such as problems urinating or erectile dysfunction.  Prostate cancer screening for men with average risk may start at age 60. Men with risk factors may need to be screened earlier at age 49-45. Once you have been screened for prostate cancer, future screening may be recommended based on the results of your blood tests. Colorectal cancer Screening for colorectal cancer is recommended starting at age 61 for most men. If you have a family history of colon or rectal cancer or other risk factors, you may need to start having screenings earlier. Talk with your health care provider about which screening test is right for you and how often you should be screened. Colorectal cancer screening looks for cancer or for growths called polyps that often form before cancer starts. Tests to look for cancer or polyps include:  Colonoscopy or flexible sigmoidoscopy. For these procedures, a flexible tube with a small camera is inserted into the rectum.  CT colonography. This test uses X-rays and a contrast dye to check the colon for polyps. If a polyp is found, you may need to have a colonoscopy so the polyp can be located and removed.  Tests to look for cancer in the stool (feces) include:  Guaiac-based fecal occult blood test (FOBT). This test detects blood in stool. It can be done at home with a kit.  Fecal immunochemical test (FIT). This test detects blood in stool. For this test, you will need to collect stool samples at home.  Stool DNA test. This test looks for blood in stool and any changes in DNA that can lead to  colon cancer. For this test, you will need to collect a stool sample at home and send it to a lab.  Skin cancer Skin cancer screening is done by checking the skin for unusual moles or spots and any changes in existing moles. Your health care provider should check your skin for signs of skin cancer at every physical exam. You should check your skin every month and tell your health care provider right away if anything looks unusual. Men with a higher-than-normal risk for skin cancer may want to see a skin specialist (dermatologist) for an annual body check. Where to find more information:  [place]: SkinPromotion.no  Centers for Disease Control and Prevention: http://knight-sullivan.biz/  American Cancer Society: https://www.cancer.org/latest-news/4-cancer-screening-tests-for-men.html Contact a health care provider if:  You have concerns about any signs or symptoms of cancer, such as: ? Moles that have an unusual shape or color. ? Changes in existing moles. ? A sore on your skin that does not heal. ? Blood in your urine or stool. ? Fatigue that does not go away. ? Frequent pain or cramping in your abdomen. ? Coughing or trouble breathing that does not go away. ? Coughing up blood. ? Losing weight without trying. ? Changes in urination habits. ? Painful urination or ejaculation. Summary  Be aware of and watch for signs and symptoms of cancer, especially symptoms of lung cancer, prostate cancer, colorectal cancer, and skin cancer.  Early detection of cancer with cancer screening may save your life.  Talk with your health care provider about your specific cancer risks.  Work together with your health care provider to create a cancer screening plan that is right for you. This information is not intended to replace advice given to you by your health care provider. Make sure you discuss any questions you have with  your health care provider. Document Released: 03/10/2016 Document Revised: 03/10/2016 Document Reviewed: 03/10/2016 Elsevier Interactive Patient Education  [employer].

## 2018-01-11 NOTE — Patient Instructions (Addendum)
   IF you received an x-ray today, you will receive an invoice from [facility]. Please contact [facility] at [phone] with questions or concerns regarding your invoice.   IF you received labwork today, you will receive an invoice from [facility]. Please contact [facility] at [phone] with questions or concerns regarding your invoice.   Our billing staff will not be able to assist you with questions regarding bills from these companies.  You will be contacted with the lab results as soon as they are available. The fastest way to get your results is to activate your My Chart account. Instructions are located on the last page of this paperwork. If you have not heard from us regarding the results in 2 weeks, please contact this office.     Cancer Screening for Men A cancer screening is a test or exam that checks for cancer. Your health care provider will recommend specific cancer screenings based on your age, personal history, and family history of cancer. Work with your health care provider to create a cancer screening schedule that protects your health. Why is cancer screening done? Cancer screening is done to look for cancer in the very early stages, before it spreads and becomes harder to treat and before you would start to notice symptoms. Finding cancer early improves the chances of successful treatment. It may save your life. Who should be screened for cancer? All men should be screened for colorectal cancer and skin cancer. Your health care provider may recommend screenings for other types of cancer if:  You had cancer before.  You have a family member with cancer.  You have abnormal genes that could increase the risk of cancer.  You have risk factors for certain cancers, such as smoking.  When you should be screened for cancer depends on:  Your age.  Your medical history and your family's medical history.  Certain lifestyle factors, such as  smoking.  Environmental exposure, such as to asbestos.  What are some common cancer screenings? Lung cancer Lung cancer screening is done with a CT scan that looks for abnormal cells in the lungs. Discuss lung cancer screening with your health care provider if you are 55-74 years old and if any of the following apply to you:  You currently smoke.  You used to smoke heavily.  You have had at least a 30-pack-year smoking history.  You have quit smoking within the past 15 years.  If you smoke heavily or if you used to smoke, you may need to be screened every year. Prostate cancer Prostate cancer screening is done with blood tests and an exam in which a health care provider uses a gloved finger to check prostate size (digital rectal exam). You may need to be screened for prostate cancer if:  You have risk factors of prostate cancer, such as being African American or having a close family member with prostate cancer.  You have inherited gene changes or a genetic condition, including BRCA1 or BRCA2 gene mutations or Lynch syndrome.  You have symptoms of prostate cancer, such as problems urinating or erectile dysfunction.  Prostate cancer screening for men with average risk may start at age 50. Men with risk factors may need to be screened earlier at age 40-45. Once you have been screened for prostate cancer, future screening may be recommended based on the results of your blood tests. Colorectal cancer Screening for colorectal cancer is recommended starting at age 50 for most men. If you have a family   history of colon or rectal cancer or other risk factors, you may need to start having screenings earlier. Talk with your health care provider about which screening test is right for you and how often you should be screened. Colorectal cancer screening looks for cancer or for growths called polyps that often form before cancer starts. Tests to look for cancer or polyps include:  Colonoscopy or  flexible sigmoidoscopy. For these procedures, a flexible tube with a small camera is inserted into the rectum.  CT colonography. This test uses X-rays and a contrast dye to check the colon for polyps. If a polyp is found, you may need to have a colonoscopy so the polyp can be located and removed.  Tests to look for cancer in the stool (feces) include:  Guaiac-based fecal occult blood test (FOBT). This test detects blood in stool. It can be done at home with a kit.  Fecal immunochemical test (FIT). This test detects blood in stool. For this test, you will need to collect stool samples at home.  Stool DNA test. This test looks for blood in stool and any changes in DNA that can lead to colon cancer. For this test, you will need to collect a stool sample at home and send it to a lab.  Skin cancer Skin cancer screening is done by checking the skin for unusual moles or spots and any changes in existing moles. Your health care provider should check your skin for signs of skin cancer at every physical exam. You should check your skin every month and tell your health care provider right away if anything looks unusual. Men with a higher-than-normal risk for skin cancer may want to see a skin specialist (dermatologist) for an annual body check. Where to find more information:  National Cancer Institute: https://www.cancer.gov/about-cancer/screening  Centers for Disease Control and Prevention: https://www.cdc.gov/cancer/dcpc/prevention/screening.htm  American Cancer Society: https://www.cancer.org/latest-news/4-cancer-screening-tests-for-men.html Contact a health care provider if:  You have concerns about any signs or symptoms of cancer, such as: ? Moles that have an unusual shape or color. ? Changes in existing moles. ? A sore on your skin that does not heal. ? Blood in your urine or stool. ? Fatigue that does not go away. ? Frequent pain or cramping in your abdomen. ? Coughing or trouble  breathing that does not go away. ? Coughing up blood. ? Losing weight without trying. ? Changes in urination habits. ? Painful urination or ejaculation. Summary  Be aware of and watch for signs and symptoms of cancer, especially symptoms of lung cancer, prostate cancer, colorectal cancer, and skin cancer.  Early detection of cancer with cancer screening may save your life.  Talk with your health care provider about your specific cancer risks.  Work together with your health care provider to create a cancer screening plan that is right for you. This information is not intended to replace advice given to you by your health care provider. Make sure you discuss any questions you have with your health care provider. Document Released: 03/10/2016 Document Revised: 03/10/2016 Document Reviewed: 03/10/2016 Elsevier Interactive Patient Education  2018 [employer].  

## 2018-01-12 ENCOUNTER — Telehealth: Payer: Self-pay | Admitting: Family Medicine

## 2018-01-12 MED ORDER — METFORMIN HCL 500 MG PO TABS: ORAL_TABLET | ORAL | 1 refills | Status: DC

## 2018-01-12 NOTE — Telephone Encounter (Signed)
Discuss results with patient that his a1c was elevated. He is to follow up in 6 months for retesting He should take metformin 500mg  bid with meals. He was instructed to cut back on starchy foods, increase exercise, cut back on fried foods and fatty foods.  His cholesterol was also high and thus he should increase his fiber He will follow up in 3 months. He was notified of his remaining labs being normal.

## 2018-01-13 LAB — COMPREHENSIVE METABOLIC PANEL
A/G RATIO: 1.3 (ref 1.2–2.2)
ALK PHOS: 115 IU/L (ref 39–117)
ALT: 21 IU/L (ref 0–44)
AST: 24 IU/L (ref 0–40)
Albumin: 4.5 g/dL (ref 3.5–5.5)
BUN / CREAT RATIO: 17 (ref 9–20)
BUN: 18 mg/dL (ref 6–24)
Bilirubin Total: 0.3 mg/dL (ref 0.0–1.2)
CO2: 26 mmol/L (ref 20–29)
CREATININE: 1.04 mg/dL (ref 0.76–1.27)
Calcium: 9.3 mg/dL (ref 8.7–10.2)
Chloride: 98 mmol/L (ref 96–106)
GFR calc Af Amer: 96 mL/min/{1.73_m2} (ref >59)
GFR, EST NON AFRICAN AMERICAN: 83 mL/min/{1.73_m2} (ref >59)
GLOBULIN, TOTAL: 3.6 g/dL (ref 1.5–4.5)
Glucose: 96 mg/dL (ref 65–99)
POTASSIUM: 3.5 mmol/L (ref 3.5–5.2)
Sodium: 140 mmol/L (ref 134–144)
TOTAL PROTEIN: 8.1 g/dL (ref 6.0–8.5)

## 2018-01-13 LAB — HEMOGLOBIN A1C
Est. average glucose Bld gHb Est-mCnc: 137 mg/dL
Hgb A1c MFr Bld: 6.4 % — ABNORMAL HIGH (ref 4.8–5.6)

## 2018-01-13 LAB — LIPID PANEL
CHOL/HDL RATIO: 3.9 ratio (ref 0.0–5.0)
Cholesterol, Total: 217 mg/dL — ABNORMAL HIGH (ref 100–199)
HDL: 55 mg/dL (ref >39)
LDL Calculated: 140 mg/dL — ABNORMAL HIGH (ref 0–99)
Triglycerides: 109 mg/dL (ref 0–149)
VLDL Cholesterol Cal: 22 mg/dL (ref 5–40)

## 2018-01-13 LAB — HIV ANTIBODY (ROUTINE TESTING W REFLEX): HIV SCREEN 4TH GENERATION: NONREACTIVE

## 2018-01-13 LAB — GC/CHLAMYDIA PROBE AMP
Chlamydia trachomatis, NAA: NEGATIVE
Neisseria gonorrhoeae by PCR: NEGATIVE

## 2018-01-13 LAB — RPR: RPR Ser Ql: NONREACTIVE

## 2018-01-13 LAB — HEPATITIS B SURFACE ANTIGEN: HEP B S AG: NEGATIVE

## 2018-01-13 LAB — MICROALBUMIN, URINE: MICROALBUM., U, RANDOM: 45.9 ug/mL

## 2018-01-13 LAB — PSA: Prostate Specific Ag, Serum: 0.6 ng/mL (ref 0.0–4.0)

## 2018-01-15 ENCOUNTER — Encounter: Payer: Managed Care, Other (non HMO) | Admitting: Family Medicine

## 2018-01-17 NOTE — Addendum Note (Signed)
Addended by: Delia Chimes A on: 01/17/2018 10:20 PM   Modules accepted: Orders

## 2018-02-13 ENCOUNTER — Other Ambulatory Visit: Payer: Self-pay | Admitting: Gastroenterology

## 2018-02-19 ENCOUNTER — Ambulatory Visit: Payer: 59 | Admitting: Registered"

## 2018-03-29 ENCOUNTER — Encounter (HOSPITAL_COMMUNITY): Payer: Self-pay | Admitting: *Deleted

## 2018-03-29 ENCOUNTER — Ambulatory Visit (HOSPITAL_COMMUNITY): Payer: Managed Care, Other (non HMO) | Admitting: Anesthesiology

## 2018-03-29 ENCOUNTER — Other Ambulatory Visit: Payer: Self-pay

## 2018-03-29 ENCOUNTER — Encounter (HOSPITAL_COMMUNITY)
Admission: RE | Disposition: A | Discharge status: Home / Self Care | Payer: Self-pay | Source: Ambulatory Visit | Attending: Gastroenterology

## 2018-03-29 ENCOUNTER — Ambulatory Visit (HOSPITAL_COMMUNITY)
Admission: RE | Admit: 2018-03-29 | Discharge: 2018-03-29 | Disposition: A | Discharge status: Home / Self Care | Payer: Managed Care, Other (non HMO) | Source: Ambulatory Visit | Attending: Gastroenterology | Admitting: Gastroenterology

## 2018-03-29 DIAGNOSIS — Z1211 Encounter for screening for malignant neoplasm of colon: Secondary | ICD-10-CM | POA: Diagnosis present

## 2018-03-29 DIAGNOSIS — K635 Polyp of colon: Secondary | ICD-10-CM | POA: Diagnosis not present

## 2018-03-29 DIAGNOSIS — K5730 Diverticulosis of large intestine without perforation or abscess without bleeding: Secondary | ICD-10-CM | POA: Diagnosis not present

## 2018-03-29 DIAGNOSIS — K6389 Other specified diseases of intestine: Secondary | ICD-10-CM | POA: Diagnosis not present

## 2018-03-29 DIAGNOSIS — E669 Obesity, unspecified: Secondary | ICD-10-CM | POA: Diagnosis not present

## 2018-03-29 DIAGNOSIS — Z79899 Other long term (current) drug therapy: Secondary | ICD-10-CM | POA: Diagnosis not present

## 2018-03-29 DIAGNOSIS — G4733 Obstructive sleep apnea (adult) (pediatric): Secondary | ICD-10-CM | POA: Diagnosis not present

## 2018-03-29 DIAGNOSIS — J45909 Unspecified asthma, uncomplicated: Secondary | ICD-10-CM | POA: Insufficient documentation

## 2018-03-29 DIAGNOSIS — E785 Hyperlipidemia, unspecified: Secondary | ICD-10-CM | POA: Diagnosis not present

## 2018-03-29 DIAGNOSIS — Z6834 Body mass index (BMI) 34.0-34.9, adult: Secondary | ICD-10-CM | POA: Diagnosis not present

## 2018-03-29 DIAGNOSIS — I10 Essential (primary) hypertension: Secondary | ICD-10-CM | POA: Insufficient documentation

## 2018-03-29 HISTORY — PX: COLONOSCOPY WITH PROPOFOL: SHX5780

## 2018-03-29 HISTORY — PX: POLYPECTOMY: SHX5525

## 2018-03-29 SURGERY — COLONOSCOPY WITH PROPOFOL
Anesthesia: Monitor Anesthesia Care

## 2018-03-29 MED ORDER — PROPOFOL 500 MG/50ML IV EMUL
INTRAVENOUS | Status: DC | PRN
  Administered 2018-03-29: 200 ug/kg/min via INTRAVENOUS

## 2018-03-29 MED ORDER — PROPOFOL 10 MG/ML IV BOLUS
INTRAVENOUS | Status: AC
  Filled 2018-03-29: qty 20

## 2018-03-29 MED ORDER — ONDANSETRON HCL 4 MG/2ML IJ SOLN
INTRAMUSCULAR | Status: DC | PRN
  Administered 2018-03-29: 4 mg via INTRAVENOUS

## 2018-03-29 MED ORDER — LACTATED RINGERS IV SOLN
INTRAVENOUS | Status: DC
  Administered 2018-03-29: 07:00:00 via INTRAVENOUS

## 2018-03-29 MED ORDER — SODIUM CHLORIDE 0.9 % IV SOLN: INTRAVENOUS | Status: DC

## 2018-03-29 MED ORDER — PROPOFOL 10 MG/ML IV BOLUS
INTRAVENOUS | Status: AC
  Filled 2018-03-29: qty 40

## 2018-03-29 MED ORDER — PROPOFOL 10 MG/ML IV BOLUS
INTRAVENOUS | Status: DC | PRN
  Administered 2018-03-29: 20 mg via INTRAVENOUS
  Administered 2018-03-29: 10 mg via INTRAVENOUS

## 2018-03-29 MED ORDER — LIDOCAINE 2% (20 MG/ML) 5 ML SYRINGE
INTRAMUSCULAR | Status: DC | PRN
  Administered 2018-03-29: 100 mg via INTRAVENOUS

## 2018-03-29 SURGICAL SUPPLY — 22 items

## 2018-03-29 NOTE — Transfer of Care (Signed)
Immediate Anesthesia Transfer of Care Note  Patient: Edward Erickson  Procedure(s) Performed: COLONOSCOPY WITH PROPOFOL (N/A ) POLYPECTOMY  Patient Location: PACU  Anesthesia Type:MAC  Level of Consciousness: awake, alert  and oriented  Airway & Oxygen Therapy: Patient Spontanous Breathing and Patient connected to face mask oxygen  Post-op Assessment: Report given to RN and Post -op Vital signs reviewed and stable  Post vital signs: Reviewed and stable  Last Vitals:  Vitals Value Taken Time  BP    Temp    Pulse    Resp    SpO2      Last Pain:  Vitals:   03/29/18 0622  TempSrc: Oral  PainSc: 0-No pain         Complications: No apparent anesthesia complications

## 2018-03-29 NOTE — Anesthesia Procedure Notes (Signed)
Procedure Name: MAC Date/Time: 03/29/2018 7:27 AM Performed by: Maxwell Caul, CRNA Pre-anesthesia Checklist: Patient identified, Emergency Drugs available, Suction available and Patient being monitored Oxygen Delivery Method: Simple face mask

## 2018-03-29 NOTE — Op Note (Signed)
[facility] Patient Name: Edward Erickson Procedure Date: 03/29/2018 MRN: [id number] Attending MD: Juanita Craver , MD Date of Birth: [birth year] CSN: [id number] Age: 52 Admit Type: Outpatient Procedure:                Colonoscopy with cold snare polypectomy x 1. Indications:              CRC screening for colorectal malignant neoplasm. Providers:                Juanita Craver, MD, Elmer Ramp. Tilden Dome, RN, William Dalton, Technician, Virgia Land, CRNA Referring MD:             Arlie Solomons Nolon Rod, MD Medicines:                Monitored anesthesia Care Complications:            No immediate complications. Estimated Blood Loss:     Estimated blood loss was minimal. Procedure:                Pre-anesthesia assessment: - Prior to the                            procedure, a history and physical was performed,                            and patient medications and allergies were                            reviewed. The patient's tolerance of previous                            anesthesia was also reviewed. The risks and                            benefits of the procedure and the sedation options                            and risks were discussed with the patient. All                            questions were answered, and informed consent was                            obtained. Prior anticoagulants: The patient has                            taken no previous anticoagulant or antiplatelet                            agents. ASA Grade Assessment: II - A patient with                            mild systemic disease. After reviewing the risks  and benefits, the patient was deemed in                            satisfactory condition to undergo the procedure.                            After obtaining informed consent, the colonoscope                            was passed under direct vision. Throughout the   procedure, the patient's blood pressure, pulse, and                            oxygen saturations were monitored continuously. The                            CF-HQ190L ([id number]) Olympus adult colonoscope was                            introduced through the anus and advanced to the the                            terminal ileum, with identification of the                            appendiceal orifice and IC valve. The colonoscopy                            was performed without difficulty. The patient                            tolerated the procedure well. The quality of the                            bowel preparation was adequate. The terminal ileum,                            the ileocecal valve, the appendiceal orifice and                            the rectum were photographed. The bowel preparation                            used was GoLYTELY. Scope In: 7:33:08 AM Scope Out: 7:48:24 AM Scope Withdrawal Time: 0 hours 9 minutes 33 seconds  Total Procedure Duration: 0 hours 15 minutes 16 seconds  Findings:      A few small-mouthed diverticula were found in the ascending colon.      A 7 mm sessile polyp was found in the mid-ascending colon; the polyp was       removed with a cold snare x 1; resection and retrieval were complete.      The terminal ileum appeared normal.      Diffuse moderate melanosis was found in the entire colon.      No additional abnormalities were found  on retroflexion. Impression:               - A few scattered diverticula in the ascending                            colon.                           - Diffuse moderate melanosis was found in the                            entire colon                           - One 7 mm sessile polyp in the mid ascending                            colon, removed with a cold snare x 1; resected and                            retrieved.                           - The examined portion of the ileum was normal.                            - The examination was otherwise normal on direct                            and retroflexion views. Moderate Sedation:      MAC used. Recommendation:           - High fiber diet with augmented water consumption                            daily.                           - Continue present medications.                           - Await pathology results.                           - Repeat colonoscopy in 5-10 years for surveillance.                           - Return to GI office PRN.                           - If the patient has any abnormal GI symptoms in                            the interim, he has been advised to call the office                            ASAP  for further recommendations. Procedure Code(s):        --- Professional ---                           [phone], Colonoscopy, flexible; with removal of                            tumor(s), polyp(s), or other lesion(s) by snare                            technique Diagnosis Code(s):        --- Professional ---                           Z12.11, Encounter for screening for malignant                            neoplasm of colon                           D12.2, Benign neoplasm of ascending colon                           K57.30, Diverticulosis of large intestine without                            perforation or abscess without bleeding CPT copyright 2017 American Medical Association. All rights reserved. The codes documented in this report are preliminary and upon coder review may  be revised to meet current compliance requirements. Juanita Craver, MD Juanita Craver, MD 03/29/2018 8:01:47 AM This report has been signed electronically. Number of Addenda: 0

## 2018-03-29 NOTE — Anesthesia Preprocedure Evaluation (Addendum)
Anesthesia Evaluation  Patient identified by MRN, date of birth, ID band Patient awake    Reviewed: Allergy & Precautions, NPO status , Patient's Chart, lab work & pertinent test results, reviewed documented beta blocker date and time   Airway Mallampati: II  TM Distance: >3 FB Neck ROM: Full    Dental  (+) Teeth Intact, Dental Advisory Given   Pulmonary asthma , sleep apnea and Continuous Positive Airway Pressure Ventilation ,    Pulmonary exam normal breath sounds clear to auscultation       Cardiovascular hypertension, Pt. on medications and Pt. on home beta blockers Normal cardiovascular exam Rhythm:Regular Rate:Normal     Neuro/Psych negative neurological ROS     GI/Hepatic negative GI ROS, Neg liver ROS,   Endo/Other  Obesity   Renal/GU negative Renal ROS     Musculoskeletal negative musculoskeletal ROS (+)   Abdominal   Peds  Hematology negative hematology ROS (+)   Anesthesia Other Findings Day of surgery medications reviewed with the patient.  Reproductive/Obstetrics                             Anesthesia Physical Anesthesia Plan  ASA: II  Anesthesia Plan: MAC   Post-op Pain Management:    Induction: Intravenous  PONV Risk Score and Plan: 1 and Propofol infusion and Treatment may vary due to age or medical condition  Airway Management Planned: Natural Airway and Simple Face Mask  Additional Equipment:   Intra-op Plan:   Post-operative Plan:   Informed Consent: I have reviewed the patients History and Physical, chart, labs and discussed the procedure including the risks, benefits and alternatives for the proposed anesthesia with the patient or authorized representative who has indicated his/her understanding and acceptance.   Dental advisory given  Plan Discussed with: CRNA and Anesthesiologist  Anesthesia Plan Comments:         Anesthesia Quick  Evaluation

## 2018-03-29 NOTE — H&P (Signed)
Edward Erickson is an 52 y.o. male.   Chief Complaint: Colorectal cancer screening. HPI:  52 year old black male here for a screening colonoscopy. See office notes for details.  Past Medical History:  Diagnosis Date  . Asthma   . Hyperlipidemia    mild  . Hypertension   . OSA on CPAP 07/30/2013   Past Surgical History:  Procedure Laterality Date  . DIRECT LARYNGOSCOPY Right 06/06/2017   Procedure: MICRO DIRECT LARYNGOSCOPY WITH EXCISION OF VOCAL CORD MASS;  Surgeon: Leta Baptist, MD;  Location: [place] [facility];  Service: ENT;  Laterality: Right;  . THROAT SURGERY     approx five years ago unsure   Family History  Problem Relation Age of Onset  . Hypertension Mother   . Hypertension Father   . Snoring Brother        all 3 brothers  . Snoring Sister        all 3 sisters   Social History:  reports that he has never smoked. He has never used smokeless tobacco. He reports that he drinks alcohol. He reports that he does not use drugs.  Allergies: No Known Allergies  Medications Prior to Admission  Medication Sig Dispense Refill  . amLODipine (NORVASC) 10 MG tablet TAKE 1 TABLET(10 MG) BY MOUTH DAILY (Patient taking differently: Take 10 mg by mouth daily. ) 90 tablet 1  . losartan-hydrochlorothiazide (HYZAAR) 50-12.5 MG tablet Take 1 tablet by mouth daily. Follow up due January 2019. 90 tablet 1  . metoprolol tartrate (LOPRESSOR) 50 MG tablet TAKE 1 TABLET(50 MG) BY MOUTH TWICE DAILY 180 tablet 1  . sertraline (ZOLOFT) 50 MG tablet TAKE 1 TABLET(50 MG) BY MOUTH DAILY 90 tablet 1  . cetirizine (ZYRTEC) 10 MG tablet Take 1 tablet (10 mg total) by mouth daily. (Patient not taking: Reported on 01/11/2018) 30 tablet 11  . metFORMIN (GLUCOPHAGE) 500 MG tablet Take 1 tab once daily with breakfast for 1 wk then increase to metformin by 500mg  bid with meals (Patient not taking: Reported on 03/26/2018) 180 tablet 1    Review of Systems  Constitutional: Negative.   HENT: Negative.    Eyes: Negative.   Respiratory: Negative.   Cardiovascular: Negative.   Gastrointestinal: Positive for constipation.  Skin: Negative.   Neurological: Negative.   Endo/Heme/Allergies: Negative.   Psychiatric/Behavioral: Positive for depression. The patient is nervous/anxious.    Blood pressure (!) 147/82, pulse 60, temperature 98.1 F (36.7 C), temperature source Oral, resp. rate 12, height 5\' 6"  (1.676 m), weight 98 kg, SpO2 100 %. Physical Exam  Constitutional: He is oriented to person, place, and time. He appears well-developed and well-nourished.  HENT:  Head: Normocephalic and atraumatic.  Eyes: Pupils are equal, round, and reactive to light. Conjunctivae and EOM are normal.  Neck: Normal range of motion. Neck supple.  Cardiovascular: Normal rate and regular rhythm.  Respiratory: Effort normal and breath sounds normal.  GI: Soft. Bowel sounds are normal.  obese  Musculoskeletal: Normal range of motion.  Neurological: He is alert and oriented to person, place, and time.  Skin: Skin is warm and dry.  Psychiatric: He has a normal mood and affect. His behavior is normal. Judgment and thought content normal.    Assessment/Plan Colorectal cancer screening: proceed with a colonoscopy at this time.  Edward Kant, MD 03/29/2018, 7:23 AM

## 2018-03-29 NOTE — Discharge Instructions (Signed)

## 2018-03-30 ENCOUNTER — Encounter (HOSPITAL_COMMUNITY): Payer: Self-pay | Admitting: Gastroenterology

## 2018-03-30 NOTE — Anesthesia Postprocedure Evaluation (Signed)
Anesthesia Post Note  Patient: Edward Erickson  Procedure(s) Performed: COLONOSCOPY WITH PROPOFOL (N/A ) POLYPECTOMY     Patient location during evaluation: PACU Anesthesia Type: MAC Level of consciousness: awake and alert, oriented and awake Pain management: pain level controlled Vital Signs Assessment: post-procedure vital signs reviewed and stable Respiratory status: spontaneous breathing, nonlabored ventilation and respiratory function stable Cardiovascular status: stable and blood pressure returned to baseline Postop Assessment: no apparent nausea or vomiting Anesthetic complications: no    Last Vitals:  Vitals:   03/29/18 0800 03/29/18 0810  BP: 134/68 127/72  Pulse: 60 (!) 50  Resp: 16 13  Temp:    SpO2: 99% 100%    Last Pain:  Vitals:   03/29/18 0810  TempSrc:   PainSc: 0-No pain                 Catalina Gravel

## 2018-04-23 ENCOUNTER — Ambulatory Visit (INDEPENDENT_AMBULATORY_CARE_PROVIDER_SITE_OTHER): Payer: Managed Care, Other (non HMO)

## 2018-04-23 ENCOUNTER — Ambulatory Visit (INDEPENDENT_AMBULATORY_CARE_PROVIDER_SITE_OTHER): Payer: Managed Care, Other (non HMO) | Admitting: Orthopaedic Surgery

## 2018-04-23 ENCOUNTER — Ambulatory Visit (INDEPENDENT_AMBULATORY_CARE_PROVIDER_SITE_OTHER): Payer: Self-pay

## 2018-04-23 ENCOUNTER — Encounter (INDEPENDENT_AMBULATORY_CARE_PROVIDER_SITE_OTHER): Payer: Self-pay | Admitting: Orthopaedic Surgery

## 2018-04-23 VITALS — BP 126/81 | HR 63 | Ht 66.0 in | Wt 217.0 lb

## 2018-04-23 DIAGNOSIS — M25562 Pain in left knee: Secondary | ICD-10-CM

## 2018-04-23 DIAGNOSIS — G8929 Other chronic pain: Secondary | ICD-10-CM

## 2018-04-23 DIAGNOSIS — M25561 Pain in right knee: Secondary | ICD-10-CM | POA: Diagnosis not present

## 2018-04-23 MED ORDER — LIDOCAINE HCL 1 % IJ SOLN
2.0000 mL | INTRAMUSCULAR | Status: AC | PRN
  Administered 2018-04-23: 2 mL

## 2018-04-23 MED ORDER — METHYLPREDNISOLONE ACETATE 40 MG/ML IJ SUSP
80.0000 mg | INTRAMUSCULAR | Status: AC | PRN
  Administered 2018-04-23: 80 mg

## 2018-04-23 MED ORDER — BUPIVACAINE HCL 0.5 % IJ SOLN
2.0000 mL | INTRAMUSCULAR | Status: AC | PRN
  Administered 2018-04-23: 2 mL via INTRA_ARTICULAR

## 2018-04-23 NOTE — Progress Notes (Signed)
Office Visit Note   Patient: Edward Erickson           Date of Birth: 03/22/1966           MRN: [id number] Visit Date: 04/23/2018              Requested by: Forrest Moron, MD [place], [place] [address] PCP: Forrest Moron, MD   Assessment & Plan: Visit Diagnoses:  1. Chronic pain of right knee   2. Chronic pain of left knee     Plan: Aspirated left knee of 45 cc of clear yellow joint fluid and injected cortisone.  We will see him back in 2 weeks and inject cortisone right knee.  Long discussion regarding anti-inflammatory since.  Office visit over 45 minutes 50% of the time in counseling.  Follow-Up Instructions: No follow-ups on file.   Orders:  Orders Placed This Encounter  Procedures  . XR KNEE 3 VIEW LEFT  . XR KNEE 3 VIEW RIGHT   No orders of the defined types were placed in this encounter.     Procedures: Large Joint Inj: L knee on 04/23/2018 9:55 AM Indications: pain and diagnostic evaluation Details: 25 G 1.5 in needle, anteromedial approach  Arthrogram: No  Medications: 2 mL lidocaine 1 %; 2 mL bupivacaine 0.5 %; 80 mg methylPREDNISolone acetate 40 MG/ML Aspirate: 45 mL clear and yellow Outcome: tolerated well, no immediate complications Procedure, treatment alternatives, risks and benefits explained, specific risks discussed. Consent was given by the patient. Patient was prepped and draped in the usual sterile fashion.       Clinical Data: No additional findings.   Subjective: Chief Complaint  Patient presents with  . New Patient (Initial Visit)    L KNEE PAIN FOR 3 YRS NO INJURY, HAS HAD 2 CORTISONE INJECTION LAST ONE WAS 3 MO AGO CANT REMEMBER DRS NAMES NOW HAVING R KNEE PAIN FOR 46 MO  52 year old gentleman visited the office with at least a 2-year history of insidious onset left knee pain.  Also has been experiencing right knee pain for about 6 months.  Denies any history of injury or trauma.  Approximately 4 months ago he had  cortisone injected in the left knee with about "2 days relief".  He does work at a job where he is standing most of the time.  He is having pain behind the left knee with recurrent effusions.  His knee will be achy and sore and oftentimes stiff to the point that he has to "shake it to get it moving".  He did have films of his left knee in August 2018.  These were reviewed on the PACS system.  There is some mild subchondral sclerosis in the medial tibial plateau with, perhaps, slight decrease in the medial joint space.  He has tried some anti-inflammatory medicines with some temporary relief of his pain.  Not having any groin or back pain  HPI  Review of Systems  Constitutional: Negative for fatigue and fever.  HENT: Negative for ear pain.   Eyes: Negative for pain.  Respiratory: Negative for cough and shortness of breath.   Cardiovascular: Negative for leg swelling.  Gastrointestinal: Positive for constipation. Negative for diarrhea.  Genitourinary: Negative for difficulty urinating.  Musculoskeletal: Negative for back pain and neck pain.  Skin: Negative for rash.  Allergic/Immunologic: Negative for food allergies.  Neurological: Positive for weakness and numbness.  Hematological: Does not bruise/bleed easily.  Psychiatric/Behavioral: Negative for sleep disturbance.  Objective: Vital Signs: BP 126/81 (BP Location: Left Arm, Patient Position: Sitting, Cuff Size: Normal)   Pulse 63   Ht 5\' 6"  (1.676 m)   Wt 217 lb (98.4 kg)   BMI 35.02 kg/m   Physical Exam  Constitutional: He is oriented to person, place, and time. He appears well-developed and well-nourished.  HENT:  Mouth/Throat: Oropharynx is clear and moist.  Eyes: Pupils are equal, round, and reactive to light. EOM are normal.  Pulmonary/Chest: Effort normal.  Neurological: He is alert and oriented to person, place, and time.  Skin: Skin is warm and dry.  Psychiatric: He has a normal mood and affect. His behavior is normal.     Ortho Exam awake alert and oriented x3.  Comfortable sitting.  Left knee with positive effusion.  Mild diffuse medial joint pain.  Some mild discomfort in the popliteal space but without a mass.  No calf pain.  No distal edema.  Neurovascular exam intact.  Full extension.  Painless range of motion both hips.  No instability.  No patellar crepitation left knee. Right knee without effusion.  No patellar crepitation.  Mild medial joint pain.  Full extension.  No masses.  Neurovascular exam intact  Specialty Comments:  No specialty comments available.  Imaging: No results found.   PMFS History: Patient Active Problem List   Diagnosis Date Noted  . 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 OSA on CPAP 07/30/2013   Past Medical History:  Diagnosis Date  . Apnea   . Asthma   . Hyperlipidemia    mild  . Hypertension   . OSA on CPAP 07/30/2013  . Snoring     Family History  Problem Relation Age of Onset  . Hypertension Mother   . Hypertension Father   . Snoring Brother        all 3 brothers  . Snoring Sister        all 3 sisters    Past Surgical History:  Procedure Laterality Date  . COLONOSCOPY WITH PROPOFOL N/A 03/29/2018   Procedure: COLONOSCOPY WITH PROPOFOL;  Surgeon: Juanita Craver, MD;  Location: [facility];  Service: Endoscopy;  Laterality: N/A;  . DIRECT LARYNGOSCOPY Right 06/06/2017   Procedure: MICRO DIRECT LARYNGOSCOPY WITH EXCISION OF VOCAL CORD MASS;  Surgeon: Leta Baptist, MD;  Location: [facility];  Service: ENT;  Laterality: Right;  . POLYPECTOMY  03/29/2018   Procedure: POLYPECTOMY;  Surgeon: Juanita Craver, MD;  Location: [facility];  Service: Endoscopy;;  . THROAT SURGERY     approx five years ago unsure   Social History   Occupational History  . Not on file  Tobacco Use  . Smoking status: Never Smoker  . Smokeless tobacco: Never Used  Substance and Sexual Activity  . Alcohol use: Yes  . Drug use: No  . Sexual activity: Not on file

## 2018-05-07 ENCOUNTER — Encounter (INDEPENDENT_AMBULATORY_CARE_PROVIDER_SITE_OTHER): Payer: Self-pay | Admitting: Orthopaedic Surgery

## 2018-05-07 ENCOUNTER — Ambulatory Visit (INDEPENDENT_AMBULATORY_CARE_PROVIDER_SITE_OTHER): Payer: Managed Care, Other (non HMO) | Admitting: Orthopaedic Surgery

## 2018-05-07 VITALS — BP 131/84 | HR 57 | Ht 66.0 in | Wt 217.0 lb

## 2018-05-07 DIAGNOSIS — M25562 Pain in left knee: Secondary | ICD-10-CM

## 2018-05-07 DIAGNOSIS — G8929 Other chronic pain: Secondary | ICD-10-CM | POA: Diagnosis not present

## 2018-05-07 NOTE — Progress Notes (Signed)
Office Visit Note   Patient: Edward Erickson           Date of Birth: 1966-01-13           MRN: [id number] Visit Date: 05/07/2018              Requested by: Forrest Moron, MD [place], [place] [id number] PCP: Forrest Moron, MD   Assessment & Plan: Visit Diagnoses:  1. Chronic pain of left knee     Plan: Left knee pain improved since aspiration and injection with cortisone 2 weeks ago.  Still having some discomfort.  Has taken 2 Advil once or twice a day without much relief.  Long discussion regarding potential diagnoses.  I think the majority of his pain originates from the arthritis.  Will order MRI scan  Follow-Up Instructions: Return after MRI left knee.   Orders:  No orders of the defined types were placed in this encounter.  No orders of the defined types were placed in this encounter.     Procedures: No procedures performed   Clinical Data: No additional findings.   Subjective: Chief Complaint  Patient presents with  . Follow-up    R KNEE PAIN INJECTION HELPED A LITTLE BUT STILL HAVING PAIN  Seen several weeks ago for evaluation of right knee pain.  Aspiration and injection with cortisone "helped".  Still having some discomfort that "bothers me".  No fever or chills.  Some discomfort in his right knee but still having more trouble on the left.  Is been taking Advil up to 2 tablets several times a day without much relief. HPI  Review of Systems  Constitutional: Negative for fatigue and fever.  HENT: Negative for ear pain.   Eyes: Negative for pain.  Respiratory: Negative for cough and shortness of breath.   Cardiovascular: Negative for leg swelling.  Gastrointestinal: Negative for constipation and diarrhea.  Genitourinary: Negative for difficulty urinating.  Musculoskeletal: Negative for back pain and neck pain.  Skin: Negative for rash.  Allergic/Immunologic: Negative for food allergies.  Neurological: Positive for weakness and  numbness.  Hematological: Does not bruise/bleed easily.  Psychiatric/Behavioral: Negative for sleep disturbance.     Objective: Vital Signs: BP 131/84 (BP Location: Left Arm, Patient Position: Sitting, Cuff Size: Normal)   Pulse (!) 57   Ht 5\' 6"  (1.676 m)   Wt 217 lb (98.4 kg)   BMI 35.02 kg/m   Physical Exam  Constitutional: He is oriented to person, place, and time. He appears well-developed and well-nourished.  HENT:  Mouth/Throat: Oropharynx is clear and moist.  Eyes: Pupils are equal, round, and reactive to light. EOM are normal.  Pulmonary/Chest: Effort normal.  Neurological: He is alert and oriented to person, place, and time.  Skin: Skin is warm and dry.  Psychiatric: He has a normal mood and affect. His behavior is normal.    Ortho Exam left knee with positive effusion.  Minimally warm.  Some discomfort along the anterior medial joint line.  No posterior medial pain.  No lateral joint pain.  Some patellar crepitation but no pain with patella compression.  No instability.  No calf pain.  No distal edema.  Neurovascular exam intact.  Straight leg raise negative.  Painless range of motion left hip  Specialty Comments:  No specialty comments available.  Imaging: No results found.   PMFS History: Patient Active Problem List   Diagnosis Date Noted  . 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 OSA on CPAP 07/30/2013  Past Medical History:  Diagnosis Date  . Apnea   . Asthma   . Hyperlipidemia    mild  . Hypertension   . OSA on CPAP 07/30/2013  . Snoring     Family History  Problem Relation Age of Onset  . Hypertension Mother   . Hypertension Father   . Snoring Brother        all 3 brothers  . Snoring Sister        all 3 sisters    Past Surgical History:  Procedure Laterality Date  . COLONOSCOPY WITH PROPOFOL N/A 03/29/2018   Procedure: COLONOSCOPY WITH PROPOFOL;  Surgeon: Juanita Craver, MD;  Location: [facility];  Service: Endoscopy;  Laterality: N/A;  . DIRECT  LARYNGOSCOPY Right 06/06/2017   Procedure: MICRO DIRECT LARYNGOSCOPY WITH EXCISION OF VOCAL CORD MASS;  Surgeon: Leta Baptist, MD;  Location: [facility];  Service: ENT;  Laterality: Right;  . POLYPECTOMY  03/29/2018   Procedure: POLYPECTOMY;  Surgeon: Juanita Craver, MD;  Location: [facility];  Service: Endoscopy;;  . THROAT SURGERY     approx five years ago unsure   Social History   Occupational History  . Not on file  Tobacco Use  . Smoking status: Never Smoker  . Smokeless tobacco: Never Used  Substance and Sexual Activity  . Alcohol use: Yes  . Drug use: No  . Sexual activity: Not on file

## 2018-05-19 ENCOUNTER — Ambulatory Visit (HOSPITAL_BASED_OUTPATIENT_CLINIC_OR_DEPARTMENT_OTHER)
Admission: RE | Admit: 2018-05-19 | Discharge: 2018-05-19 | Disposition: A | Discharge status: Home / Self Care | Payer: Managed Care, Other (non HMO) | Source: Ambulatory Visit | Attending: Orthopaedic Surgery | Admitting: Orthopaedic Surgery

## 2018-05-19 DIAGNOSIS — S83242A Other tear of medial meniscus, current injury, left knee, initial encounter: Secondary | ICD-10-CM | POA: Insufficient documentation

## 2018-05-19 DIAGNOSIS — M71562 Other bursitis, not elsewhere classified, left knee: Secondary | ICD-10-CM | POA: Insufficient documentation

## 2018-05-19 DIAGNOSIS — G8929 Other chronic pain: Secondary | ICD-10-CM | POA: Diagnosis present

## 2018-05-19 DIAGNOSIS — X58XXXA Exposure to other specified factors, initial encounter: Secondary | ICD-10-CM | POA: Diagnosis not present

## 2018-05-19 DIAGNOSIS — M25562 Pain in left knee: Secondary | ICD-10-CM | POA: Diagnosis not present

## 2018-05-19 DIAGNOSIS — M25462 Effusion, left knee: Secondary | ICD-10-CM | POA: Insufficient documentation

## 2018-06-04 ENCOUNTER — Encounter (INDEPENDENT_AMBULATORY_CARE_PROVIDER_SITE_OTHER): Payer: Self-pay | Admitting: Orthopaedic Surgery

## 2018-06-04 ENCOUNTER — Ambulatory Visit (INDEPENDENT_AMBULATORY_CARE_PROVIDER_SITE_OTHER): Payer: Managed Care, Other (non HMO) | Admitting: Orthopaedic Surgery

## 2018-06-04 VITALS — BP 132/82 | HR 64

## 2018-06-04 DIAGNOSIS — M25562 Pain in left knee: Secondary | ICD-10-CM

## 2018-06-04 DIAGNOSIS — G8929 Other chronic pain: Secondary | ICD-10-CM | POA: Diagnosis not present

## 2018-06-04 MED ORDER — BUPIVACAINE HCL 0.5 % IJ SOLN
2.0000 mL | INTRAMUSCULAR | Status: AC | PRN
  Administered 2018-06-04: 2 mL via INTRA_ARTICULAR

## 2018-06-04 MED ORDER — METHYLPREDNISOLONE ACETATE 40 MG/ML IJ SUSP
80.0000 mg | INTRAMUSCULAR | Status: AC | PRN
  Administered 2018-06-04: 80 mg

## 2018-06-04 MED ORDER — LIDOCAINE HCL 1 % IJ SOLN
2.0000 mL | INTRAMUSCULAR | Status: AC | PRN
  Administered 2018-06-04: 2 mL

## 2018-06-04 NOTE — Progress Notes (Signed)
Office Visit Note   Patient: Edward Erickson           Date of Birth: [birth year]           MRN: [id number] Visit Date: 06/04/2018              Requested by: Forrest Moron, MD [place], [place] [address] PCP: Forrest Moron, MD   Assessment & Plan: Visit Diagnoses:  1. Chronic pain of left knee     Plan: MRI scan of left knee revealed mild chondral thinning and irregularity without a focal chondral defect he also had a root tear of the posterior horn of the medial meniscus with moderate peripheral extrusion of the meniscus from the joint.  Discussion regarding possible pain etiology along the medial aspect of his knee and specifically a combination of factors with a torn meniscus and the arthritis.  I discussed knee arthroscopy versus treating his arthritis.  He is concerned about his "pills" at the present time and would like to proceed with conservative treatment.  Will inject his knee with cortisone and discussed NSAIDs have him return as needed Follow-Up Instructions: Return if symptoms worsen or fail to improve.   Orders:  No orders of the defined types were placed in this encounter.  No orders of the defined types were placed in this encounter.     Procedures: Large Joint Inj: L knee on 06/04/2018 9:04 AM Indications: pain and diagnostic evaluation Details: 25 G 1.5 in needle, anteromedial approach  Arthrogram: No  Medications: 2 mL lidocaine 1 %; 2 mL bupivacaine 0.5 %; 80 mg methylPREDNISolone acetate 40 MG/ML Procedure, treatment alternatives, risks and benefits explained, specific risks discussed. Consent was given by the patient. Patient was prepped and draped in the usual sterile fashion.       Clinical Data: No additional findings.   Subjective: Chief Complaint  Patient presents with  . Left Knee - Pain  . Knee Pain    continued left knee pain, anterior to lateral-posterior knee pain. ibuprofen. here today to review MRI.  Having  recurrent effusions and pain in left knee as previously outlined.  Having some pain both medially and laterally.  MRI scan demonstrates pathology along the medial aspect of his left knee with a combination of mild/moderate arthritis and a tear of the posterior horn of the medial meniscus  HPI  Review of Systems   Objective: Vital Signs: BP 132/82 (BP Location: Left Arm, Patient Position: Sitting, Cuff Size: Normal)   Pulse 64   Physical Exam  Ortho Exam awake alert and oriented x3.  Comfortable sitting very minimal effusion left knee.  Really not having much pain along the medial lateral joint today full extension of flexion over 100 degrees without instability.  No calf pain.  I did not feel any popliteal mass.  Slight increased varus with weightbearing but no limp  Specialty Comments:  No specialty comments available.  Imaging: No results found.   PMFS History: Patient Active Problem List   Diagnosis Date Noted  . 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 OSA on CPAP 07/30/2013   Past Medical History:  Diagnosis Date  . Apnea   . Asthma   . Hyperlipidemia    mild  . Hypertension   . OSA on CPAP 07/30/2013  . Snoring     Family History  Problem Relation Age of Onset  . Hypertension Mother   . Hypertension Father   . Snoring Brother  all 3 brothers  . Snoring Sister        all 3 sisters    Past Surgical History:  Procedure Laterality Date  . COLONOSCOPY WITH PROPOFOL N/A 03/29/2018   Procedure: COLONOSCOPY WITH PROPOFOL;  Surgeon: Juanita Craver, MD;  Location: [facility];  Service: Endoscopy;  Laterality: N/A;  . DIRECT LARYNGOSCOPY Right 06/06/2017   Procedure: MICRO DIRECT LARYNGOSCOPY WITH EXCISION OF VOCAL CORD MASS;  Surgeon: Leta Baptist, MD;  Location: [place];  Service: ENT;  Laterality: Right;  . POLYPECTOMY  03/29/2018   Procedure: POLYPECTOMY;  Surgeon: Juanita Craver, MD;  Location: [facility];  Service: Endoscopy;;  . THROAT SURGERY     approx  five years ago unsure   Social History   Occupational History  . Not on file  Tobacco Use  . Smoking status: Never Smoker  . Smokeless tobacco: Never Used  Substance and Sexual Activity  . Alcohol use: Yes  . Drug use: No  . Sexual activity: Not on file     Garald Balding, MD   Note - This record has been created using [employer].  Chart creation errors have been sought, but may not always  have been located. Such creation errors do not reflect on  the standard of medical care.

## 2018-07-16 ENCOUNTER — Ambulatory Visit: Payer: Managed Care, Other (non HMO) | Admitting: Family Medicine

## 2018-07-26 ENCOUNTER — Other Ambulatory Visit: Payer: Self-pay | Admitting: Family Medicine

## 2018-07-26 DIAGNOSIS — J302 Other seasonal allergic rhinitis: Secondary | ICD-10-CM

## 2018-07-26 DIAGNOSIS — I10 Essential (primary) hypertension: Secondary | ICD-10-CM

## 2018-07-26 MED ORDER — LOSARTAN POTASSIUM-HCTZ 50-12.5 MG PO TABS: 1.0000 | ORAL_TABLET | Freq: Every day | ORAL | 0 refills | Status: DC

## 2018-07-26 MED ORDER — AMLODIPINE BESYLATE 10 MG PO TABS: ORAL_TABLET | ORAL | 0 refills | Status: DC

## 2018-07-26 MED ORDER — METOPROLOL TARTRATE 50 MG PO TABS: ORAL_TABLET | ORAL | 0 refills | Status: DC

## 2018-07-26 MED ORDER — CETIRIZINE HCL 10 MG PO TABS: 10.0000 mg | ORAL_TABLET | Freq: Every day | ORAL | 0 refills | Status: DC

## 2018-07-26 NOTE — Telephone Encounter (Signed)
30 courtesy refill provided until patient reschedules appointment

## 2018-07-26 NOTE — Telephone Encounter (Signed)
Copied from [place] [id number]. Topic: Quick Communication - Rx Refill/Question >> Jul 26, 2018  9:43 AM Percell Belt A wrote: Medication:  amLODipine (NORVASC) 10 MG tablet [[id number]]  cetirizine (ZYRTEC) 10 MG tablet [[id number]]  losartan-hydrochlorothiazide (HYZAAR) 50-12.5 MG tablet [[id number] metoprolol tartrate (LOPRESSOR) 50 MG tablet [[id number]]   Has the patient contacted their pharmacy? Yes havent rec'd the request  (Agent: If no, request that the patient contact the pharmacy for the refill.) (Agent: If yes, when and what did the pharmacy advise?)  Preferred Pharmacy (with phone number or street name): [facility], [place] - [place] [phone] (Phone)   Agent: Please be advised that RX refills may take up to 3 business days. We ask that you follow-up with your pharmacy.

## 2018-07-30 ENCOUNTER — Telehealth: Payer: Self-pay | Admitting: Family Medicine

## 2018-07-30 NOTE — Telephone Encounter (Signed)
Pt stopped by office to ask for a refill of sertraline 50 MG tablets. Pt states that the pharmacy did not ask for a refill when he asked them for it. Pt has scheduled an appt for 08/28/18 with Dr. Priscille Heidelberg. Pt iwnats to know if he may have a refill until then.

## 2018-07-31 ENCOUNTER — Other Ambulatory Visit: Payer: Self-pay

## 2018-07-31 DIAGNOSIS — F411 Generalized anxiety disorder: Secondary | ICD-10-CM

## 2018-07-31 MED ORDER — SERTRALINE HCL 50 MG PO TABS: ORAL_TABLET | ORAL | 0 refills | Status: DC

## 2018-07-31 NOTE — Telephone Encounter (Signed)
RX sent in

## 2018-08-28 ENCOUNTER — Ambulatory Visit: Payer: Managed Care, Other (non HMO) | Admitting: Family Medicine

## 2018-08-28 ENCOUNTER — Other Ambulatory Visit: Payer: Self-pay

## 2018-08-28 ENCOUNTER — Encounter: Payer: Self-pay | Admitting: Family Medicine

## 2018-08-28 VITALS — BP 132/80 | HR 65 | Temp 98.0°F | Resp 16 | Ht 66.0 in | Wt 215.0 lb

## 2018-08-28 DIAGNOSIS — F411 Generalized anxiety disorder: Secondary | ICD-10-CM

## 2018-08-28 DIAGNOSIS — Z23 Encounter for immunization: Secondary | ICD-10-CM | POA: Diagnosis not present

## 2018-08-28 DIAGNOSIS — L739 Follicular disorder, unspecified: Secondary | ICD-10-CM

## 2018-08-28 DIAGNOSIS — R7303 Prediabetes: Secondary | ICD-10-CM | POA: Diagnosis not present

## 2018-08-28 DIAGNOSIS — E876 Hypokalemia: Secondary | ICD-10-CM

## 2018-08-28 DIAGNOSIS — I10 Essential (primary) hypertension: Secondary | ICD-10-CM | POA: Diagnosis not present

## 2018-08-28 DIAGNOSIS — E782 Mixed hyperlipidemia: Secondary | ICD-10-CM

## 2018-08-28 LAB — COMPREHENSIVE METABOLIC PANEL
A/G RATIO: 1.5 (ref 1.2–2.2)
ALT: 26 IU/L (ref 0–44)
AST: 27 IU/L (ref 0–40)
Albumin: 4.6 g/dL (ref 3.8–4.9)
Alkaline Phosphatase: 109 IU/L (ref 39–117)
BILIRUBIN TOTAL: 0.3 mg/dL (ref 0.0–1.2)
BUN/Creatinine Ratio: 15 (ref 9–20)
BUN: 12 mg/dL (ref 6–24)
CALCIUM: 9.2 mg/dL (ref 8.7–10.2)
CO2: 24 mmol/L (ref 20–29)
Chloride: 100 mmol/L (ref 96–106)
Creatinine, Ser: 0.78 mg/dL (ref 0.76–1.27)
GFR calc Af Amer: 120 mL/min/{1.73_m2} (ref >59)
GFR, EST NON AFRICAN AMERICAN: 104 mL/min/{1.73_m2} (ref >59)
GLUCOSE: 80 mg/dL (ref 65–99)
Globulin, Total: 3.1 g/dL (ref 1.5–4.5)
POTASSIUM: 3.3 mmol/L — AB (ref 3.5–5.2)
Sodium: 140 mmol/L (ref 134–144)
Total Protein: 7.7 g/dL (ref 6.0–8.5)

## 2018-08-28 LAB — LIPID PANEL
CHOL/HDL RATIO: 3.4 ratio (ref 0.0–5.0)
Cholesterol, Total: 179 mg/dL (ref 100–199)
HDL: 52 mg/dL (ref >39)
LDL CALC: 107 mg/dL — AB (ref 0–99)
TRIGLYCERIDES: 98 mg/dL (ref 0–149)
VLDL Cholesterol Cal: 20 mg/dL (ref 5–40)

## 2018-08-28 LAB — POCT GLYCOSYLATED HEMOGLOBIN (HGB A1C): Hemoglobin A1C: 6.1 % — AB (ref 4.0–5.6)

## 2018-08-28 MED ORDER — SERTRALINE HCL 50 MG PO TABS: ORAL_TABLET | ORAL | 3 refills | Status: DC

## 2018-08-28 MED ORDER — METOPROLOL TARTRATE 50 MG PO TABS: ORAL_TABLET | ORAL | 1 refills | Status: DC

## 2018-08-28 MED ORDER — TRIAMCINOLONE ACETONIDE 0.1 % EX CREA: 1.0000 "application " | TOPICAL_CREAM | Freq: Two times a day (BID) | CUTANEOUS | 0 refills | Status: DC

## 2018-08-28 MED ORDER — LOSARTAN POTASSIUM-HCTZ 50-12.5 MG PO TABS: 1.0000 | ORAL_TABLET | Freq: Every day | ORAL | 1 refills | Status: DC

## 2018-08-28 MED ORDER — AMLODIPINE BESYLATE 10 MG PO TABS: ORAL_TABLET | ORAL | 1 refills | Status: DC

## 2018-08-28 NOTE — Patient Instructions (Addendum)
Wash with Head and Shoulder's Classic Clean with luke warm water Shave the scalp after with fragrance free and sensitive skin shaving cream and disposable razors Rinse off with luke warm water and apply your triamcinolone cream to the scalp. Keep scalp covered from the sun After the itching stops then the scars should improve Do the triamcinolone twice a day (applied to clean dry scalp)  If you have lab work done today you will be contacted with your lab results within the next 2 weeks.  If you have not heard from [place] then please contact us. The fastest way to get your results is to register for My Chart.   IF you received an x-ray today, you will receive an invoice from [facility]. Please contact [facility] at [phone] with questions or concerns regarding your invoice.   IF you received labwork today, you will receive an invoice from [facility]. Please contact [facility] at [phone] with questions or concerns regarding your invoice.   Our billing staff will not be able to assist you with questions regarding bills from these companies.  You will be contacted with the lab results as soon as they are available. The fastest way to get your results is to activate your My Chart account. Instructions are located on the last page of this paperwork. If you have not heard from [place] regarding the results in 2 weeks, please contact this office.      Folliculitis  Folliculitis is inflammation of the hair follicles. Folliculitis most commonly occurs on the scalp, thighs, legs, back, and buttocks. However, it can occur anywhere on the body. What are the causes? This condition may be caused by:  A bacterial infection (common).  A fungal infection.  A viral infection.  Coming into contact with certain chemicals, especially oils and tars.  Shaving or waxing.  Applying greasy ointments or creams to your skin often. Long-lasting folliculitis and folliculitis that keeps  coming back can be caused by bacteria that live in the nostrils. What increases the risk? This condition is more likely to develop in people with:  A weakened immune system.  Diabetes.  Obesity. What are the signs or symptoms? Symptoms of this condition include:  Redness.  Soreness.  Swelling.  Itching.  Small white or yellow, pus-filled, itchy spots (pustules) that appear over a reddened area. If there is an infection that goes deep into the follicle, these may develop into a boil (furuncle).  A group of closely packed boils (carbuncle). These tend to form in hairy, sweaty areas of the body. How is this diagnosed? This condition is diagnosed with a skin exam. To find what is causing the condition, your health care provider may take a sample of one of the pustules or boils for testing. How is this treated? This condition may be treated by:  Applying warm compresses to the affected areas.  Taking an antibiotic medicine or applying an antibiotic medicine to the skin.  Applying or bathing with an antiseptic solution.  Taking an over-the-counter medicine to help with itching.  Having a procedure to drain any pustules or boils. This may be done if a pustule or boil contains a lot of pus or fluid.  Laser hair removal. This may be done to treat long-lasting folliculitis. Follow these instructions at home:  If directed, apply heat to the affected area as often as told by your health care provider. Use the heat source that your health care provider recommends, such as a moist heat pack or  a heating pad. ? Place a towel between your skin and the heat source. ? Leave the heat on for 20-30 minutes. ? Remove the heat if your skin turns bright red. This is especially important if you are unable to feel pain, heat, or cold. You may have a greater risk of getting burned.  If you were prescribed an antibiotic medicine, use it as told by your health care provider. Do not stop using the  antibiotic even if you start to feel better.  Take over-the-counter and prescription medicines only as told by your health care provider.  Do not shave irritated skin.  Keep all follow-up visits as told by your health care provider. This is important. Get help right away if:  You have more redness, swelling, or pain in the affected area.  Red streaks are spreading from the affected area.  You have a fever. This information is not intended to replace advice given to you by your health care provider. Make sure you discuss any questions you have with your health care provider. Document Released: 08/22/2001 Document Revised: 01/01/2016 Document Reviewed: 04/03/2015 Elsevier Interactive Patient Education  2019 [employer].

## 2018-08-28 NOTE — Progress Notes (Signed)
Established Patient Office Visit  Subjective:  Patient ID: Edward Erickson, male    DOB: Nov 08, 1965  Age: 53 y.o. MRN: [id number]  CC:  Chief Complaint  Patient presents with  . Hypertension    follow-up   . Hyperlipidemia    HPI Edward Erickson presents for   Scalp irritation He has been having itchy scalp He shaves his head bald and scratches until the skin breaks He has some scars He denies any pus or drainage  Prediabetes and obesity He is exercising twice a week for an hour. Lab Results  Component Value Date   HGBA1C 6.1 (A) 08/28/2018  he is exercising 2 times a week He is eating 3 meals a day Last night he drank coca cola at work He states that the metformin made him very nauseous so he only took two pills.  Wt Readings from Last 3 Encounters:  08/28/18 215 lb (97.5 kg)  05/07/18 217 lb (98.4 kg)  04/23/18 217 lb (98.4 kg)   Body mass index is 34.7 kg/m.  Depression- mild depression  Depression screen [facility] 2/9 08/28/2018 01/11/2018 01/11/2018  Decreased Interest 0 3 0  Down, Depressed, Hopeless 0 3 0  PHQ - 2 Score 0 6 0  Altered sleeping - 0 -  Tired, decreased energy - 0 -  Change in appetite - 1 -  Feeling bad or failure about yourself  - 1 -  Trouble concentrating - 0 -  Moving slowly or fidgety/restless - 2 -  Suicidal thoughts - 0 -  PHQ-9 Score - 10 -  Difficult doing work/chores - Somewhat difficult -   States that his mood is better on zoloft  Dyslipidemia: Patient presents for evaluation of lipids.  Compliance with treatment thus far has been good.  A repeat fasting lipid profile was done.  The patient does not use medications that may worsen dyslipidemias (corticosteroids, progestins, anabolic steroids, diuretics, beta-blockers, amiodarone, cyclosporine, olanzapine). The patient exercises intermittently.  The patient is not known to have coexisting coronary artery disease.   Lab Results  Component Value Date   CHOL 179 08/28/2018     CHOL 217 (H) 01/11/2018   CHOL 180 01/09/2017   Lab Results  Component Value Date   HDL 52 08/28/2018   HDL 55 01/11/2018   HDL 44 01/09/2017   Lab Results  Component Value Date   LDLCALC 107 (H) 08/28/2018   LDLCALC 140 (H) 01/11/2018   LDLCALC 117 (H) 01/09/2017   Lab Results  Component Value Date   TRIG 98 08/28/2018   TRIG 109 01/11/2018   TRIG 95 01/09/2017   Lab Results  Component Value Date   CHOLHDL 3.4 08/28/2018   CHOLHDL 3.9 01/11/2018   CHOLHDL 4.1 01/09/2017   No results found for: LDLDIRECT    Past Medical History:  Diagnosis Date  . Apnea   . Asthma   . Hyperlipidemia    mild  . Hypertension   . OSA on CPAP 07/30/2013  . Snoring     Past Surgical History:  Procedure Laterality Date  . COLONOSCOPY WITH PROPOFOL N/A 03/29/2018   Procedure: COLONOSCOPY WITH PROPOFOL;  Surgeon: Juanita Craver, MD;  Location: [facility];  Service: Endoscopy;  Laterality: N/A;  . DIRECT LARYNGOSCOPY Right 06/06/2017   Procedure: MICRO DIRECT LARYNGOSCOPY WITH EXCISION OF VOCAL CORD MASS;  Surgeon: Leta Baptist, MD;  Location: [place];  Service: ENT;  Laterality: Right;  . POLYPECTOMY  03/29/2018   Procedure: POLYPECTOMY;  Surgeon:  Juanita Craver, MD;  Location: [facility];  Service: Endoscopy;;  . THROAT SURGERY     approx five years ago unsure    Family History  Problem Relation Age of Onset  . Hypertension Mother   . Hypertension Father   . Snoring Brother        all 3 brothers  . Snoring Sister        all 3 sisters    Social History   Socioeconomic History  . Marital status: Married    Spouse name: Not on file  . Number of children: Not on file  . Years of education: Not on file  . Highest education level: Not on file  Occupational History  . Not on file  Social Needs  . Financial resource strain: Not on file  . Food insecurity:    Worry: Not on file    Inability: Not on file  . Transportation needs:    Medical: Not on file     Non-medical: Not on file  Tobacco Use  . Smoking status: Never Smoker  . Smokeless tobacco: Never Used  Substance and Sexual Activity  . Alcohol use: Yes  . Drug use: No  . Sexual activity: Not on file  Lifestyle  . Physical activity:    Days per week: Not on file    Minutes per session: Not on file  . Stress: Not on file  Relationships  . Social connections:    Talks on phone: Not on file    Gets together: Not on file    Attends religious service: Not on file    Active member of club or organization: Not on file    Attends meetings of clubs or organizations: Not on file    Relationship status: Not on file  . Intimate partner violence:    Fear of current or ex partner: Not on file    Emotionally abused: Not on file    Physically abused: Not on file    Forced sexual activity: Not on file  Other Topics Concern  . Not on file  Social History Narrative  . Not on file    Outpatient Medications Prior to Visit  Medication Sig Dispense Refill  . LINZESS 290 MCG CAPS capsule TK 1 C PO QD  4  . amLODipine (NORVASC) 10 MG tablet TAKE 1 TABLET(10 MG) BY MOUTH DAILY 30 tablet 0  . losartan-hydrochlorothiazide (HYZAAR) 50-12.5 MG tablet Take 1 tablet by mouth daily. Follow up due January 2019. 30 tablet 0  . metoprolol tartrate (LOPRESSOR) 50 MG tablet TAKE 1 TABLET(50 MG) BY MOUTH TWICE DAILY 60 tablet 0  . sertraline (ZOLOFT) 50 MG tablet TAKE 1 TABLET(50 MG) BY MOUTH DAILY 30 tablet 0  . cetirizine (ZYRTEC) 10 MG tablet Take 1 tablet (10 mg total) by mouth daily. 30 tablet 0  . metFORMIN (GLUCOPHAGE) 500 MG tablet Take 1 tab once daily with breakfast for 1 wk then increase to metformin by 500mg  bid with meals 180 tablet 1   No facility-administered medications prior to visit.     No Known Allergies  ROS Review of Systems    Objective:    Physical Exam  BP 132/80   Pulse 65   Temp 98 F (36.7 C) (Oral)   Resp 16   Ht 5\' 6"  (1.676 m)   Wt 215 lb (97.5 kg)   SpO2 97%    BMI 34.70 kg/m  Wt Readings from Last 3 Encounters:  08/28/18 215 lb (97.5 kg)  05/07/18 217 lb (98.4 kg)  04/23/18 217 lb (98.4 kg)    Physical Exam  Constitutional: Oriented to person, place, and time. Appears well-developed and well-nourished.  HENT:  Head: Normocephalic and atraumatic.  Eyes: Conjunctivae and EOM are normal.  Cardiovascular: Normal rate, regular rhythm, normal heart sounds and intact distal pulses.  No murmur heard. Pulmonary/Chest: Effort normal and breath sounds normal. No stridor. No respiratory distress. Has no wheezes.  Abdomen: nondistended, normoactive bs, soft, nontender Neurological: Is alert and oriented to person, place, and time.  Skin: Skin is warm. Capillary refill takes less than 2 seconds.  Skin on scalp with excoriation and scarring from picking Psychiatric: Has a normal mood and affect. Behavior is normal. Judgment and thought content normal.    There are no preventive care reminders to display for this patient.  There are no preventive care reminders to display for this patient.  Lab Results  Component Value Date   TSH 0.517 01/09/2017   Lab Results  Component Value Date   WBC 4.7 01/09/2017   HGB 13.4 01/09/2017   HCT 42.0 01/09/2017   MCV 84 01/09/2017   PLT 152 01/09/2017   Lab Results  Component Value Date   NA 140 08/28/2018   K 3.3 (L) 08/28/2018   CO2 24 08/28/2018   GLUCOSE 80 08/28/2018   BUN 12 08/28/2018   CREATININE 0.78 08/28/2018   BILITOT 0.3 08/28/2018   ALKPHOS 109 08/28/2018   AST 27 08/28/2018   ALT 26 08/28/2018   PROT 7.7 08/28/2018   ALBUMIN 4.6 08/28/2018   CALCIUM 9.2 08/28/2018   ANIONGAP 5 06/02/2017   Lab Results  Component Value Date   CHOL 179 08/28/2018   Lab Results  Component Value Date   HDL 52 08/28/2018   Lab Results  Component Value Date   LDLCALC 107 (H) 08/28/2018   Lab Results  Component Value Date   TRIG 98 08/28/2018   Lab Results  Component Value Date   CHOLHDL  3.4 08/28/2018   Lab Results  Component Value Date   HGBA1C 6.1 (A) 08/28/2018      Assessment & Plan:   Problem List Items Addressed This Visit    None    Visit Diagnoses    Prediabetes    -  Primary Discussed a1c as well as goals to prevent diabetes a1c improved slightly   Relevant Orders   POCT glycosylated hemoglobin (Hb A1C) (Completed)   Need for vaccination       Relevant Orders   Flu Vaccine QUAD 36+ mos IM (Completed)   Mixed hyperlipidemia    -  Discussed cholesterol goal Discussed diet and exercise   Relevant Medications   losartan-hydrochlorothiazide (HYZAAR) 50-12.5 MG tablet   metoprolol tartrate (LOPRESSOR) 50 MG tablet   amLODipine (NORVASC) 10 MG tablet   Other Relevant Orders   Lipid panel (Completed)   Comprehensive metabolic panel (Completed)   Essential hypertension    -  Patient's blood pressure is at goal of 139/89 or less. Condition is stable. Continue current medications and treatment plan. I recommend that you exercise for 30-45 minutes 5 days a week. I also recommend a balanced diet with fruits and vegetables every day, lean meats, and little fried foods. The DASH diet (you can find this online) is a good example of this.    Relevant Medications   losartan-hydrochlorothiazide (HYZAAR) 50-12.5 MG tablet   metoprolol tartrate (LOPRESSOR) 50 MG tablet   amLODipine (NORVASC) 10 MG tablet   Other  Relevant Orders   Lipid panel (Completed)   Comprehensive metabolic panel (Completed)   Folliculitis    -  Ordered triamcinolone and reviewed skin care   Generalized anxiety disorder       Relevant Medications   sertraline (ZOLOFT) 50 MG tablet      Meds ordered this encounter  Medications  . triamcinolone cream (KENALOG) 0.1 %    Sig: Apply 1 application topically 2 (two) times daily.    Dispense:  30 g    Refill:  0  . losartan-hydrochlorothiazide (HYZAAR) 50-12.5 MG tablet    Sig: Take 1 tablet by mouth daily.    Dispense:  90 tablet     Refill:  1  . metoprolol tartrate (LOPRESSOR) 50 MG tablet    Sig: TAKE 1 TABLET(50 MG) BY MOUTH TWICE DAILY    Dispense:  180 tablet    Refill:  1  . amLODipine (NORVASC) 10 MG tablet    Sig: TAKE 1 TABLET(10 MG) BY MOUTH DAILY    Dispense:  90 tablet    Refill:  1  . sertraline (ZOLOFT) 50 MG tablet    Sig: TAKE 1 TABLET(50 MG) BY MOUTH DAILY    Dispense:  90 tablet    Refill:  3    Follow-up: Return in about 6 months (around 02/28/2019) for prediabetes and hypertension .    Forrest Moron, MD

## 2018-08-30 ENCOUNTER — Telehealth: Payer: Self-pay

## 2018-08-30 NOTE — Telephone Encounter (Signed)
Pt ambulatory to clinic requesting 03/03 lab results.  Reviewed chart.  Provider has not reviewed results, unable to provide lab results to patient.  Advised patient that we ask up to 1-2 weeks for provider to review results and document treatment plan.  Pt upset that results cannot be given to him at this time.

## 2018-09-13 ENCOUNTER — Other Ambulatory Visit: Payer: Self-pay

## 2018-09-13 ENCOUNTER — Ambulatory Visit (INDEPENDENT_AMBULATORY_CARE_PROVIDER_SITE_OTHER): Payer: Managed Care, Other (non HMO) | Admitting: Family Medicine

## 2018-09-13 DIAGNOSIS — E876 Hypokalemia: Secondary | ICD-10-CM | POA: Diagnosis not present

## 2018-09-14 LAB — BASIC METABOLIC PANEL
BUN/Creatinine Ratio: 9 (ref 9–20)
BUN: 8 mg/dL (ref 6–24)
CALCIUM: 8.9 mg/dL (ref 8.7–10.2)
CHLORIDE: 100 mmol/L (ref 96–106)
CO2: 26 mmol/L (ref 20–29)
Creatinine, Ser: 0.88 mg/dL (ref 0.76–1.27)
GFR calc non Af Amer: 99 mL/min/{1.73_m2} (ref >59)
GFR, EST AFRICAN AMERICAN: 114 mL/min/{1.73_m2} (ref >59)
Glucose: 106 mg/dL — ABNORMAL HIGH (ref 65–99)
POTASSIUM: 3.3 mmol/L — AB (ref 3.5–5.2)
Sodium: 142 mmol/L (ref 134–144)

## 2018-09-17 ENCOUNTER — Other Ambulatory Visit: Payer: Self-pay | Admitting: Family Medicine

## 2018-09-17 DIAGNOSIS — E876 Hypokalemia: Secondary | ICD-10-CM

## 2018-09-17 MED ORDER — POTASSIUM CHLORIDE 20 MEQ PO PACK: 20.0000 meq | PACK | Freq: Every day | ORAL | 11 refills | Status: DC

## 2019-02-15 ENCOUNTER — Other Ambulatory Visit: Payer: Self-pay | Admitting: Family Medicine

## 2019-02-15 DIAGNOSIS — I10 Essential (primary) hypertension: Secondary | ICD-10-CM

## 2019-02-15 NOTE — Telephone Encounter (Signed)
Forwarding medication refill request to the clinical pool for review. 

## 2019-03-06 ENCOUNTER — Ambulatory Visit: Payer: Managed Care, Other (non HMO) | Admitting: Family Medicine

## 2019-03-13 ENCOUNTER — Other Ambulatory Visit: Payer: Self-pay

## 2019-03-13 ENCOUNTER — Ambulatory Visit: Payer: Managed Care, Other (non HMO) | Admitting: Family Medicine

## 2019-03-13 ENCOUNTER — Encounter: Payer: Self-pay | Admitting: Family Medicine

## 2019-03-13 VITALS — BP 132/82 | HR 58 | Temp 98.2°F | Resp 18 | Ht 66.0 in | Wt 210.8 lb

## 2019-03-13 DIAGNOSIS — I10 Essential (primary) hypertension: Secondary | ICD-10-CM

## 2019-03-13 DIAGNOSIS — E782 Mixed hyperlipidemia: Secondary | ICD-10-CM

## 2019-03-13 DIAGNOSIS — R7303 Prediabetes: Secondary | ICD-10-CM

## 2019-03-13 DIAGNOSIS — E876 Hypokalemia: Secondary | ICD-10-CM | POA: Diagnosis not present

## 2019-03-13 DIAGNOSIS — J029 Acute pharyngitis, unspecified: Secondary | ICD-10-CM

## 2019-03-13 MED ORDER — POTASSIUM CHLORIDE 20 MEQ PO PACK: 20.0000 meq | PACK | Freq: Every day | ORAL | 11 refills | Status: DC

## 2019-03-13 MED ORDER — LINZESS 290 MCG PO CAPS: ORAL_CAPSULE | ORAL | 4 refills | Status: DC

## 2019-03-13 MED ORDER — CETIRIZINE HCL 10 MG PO TABS: 10.0000 mg | ORAL_TABLET | Freq: Every day | ORAL | 11 refills | Status: DC

## 2019-03-13 NOTE — Progress Notes (Signed)
Established Patient Office Visit  Subjective:  Patient ID: Edward Erickson, male    DOB: 02-Mar-1966  Age: 53 y.o. MRN: [id number]  CC:  Chief Complaint  Patient presents with  . Hypertension    and pre-dm f/u    HPI Edward Erickson presents for   Hypertension: Patient here for follow-up of elevated blood pressure. He is exercising and is adherent to low salt diet.  Blood pressure is well controlled at home. Cardiac symptoms none. Patient denies chest pain, chest pressure/discomfort, dyspnea, fatigue, irregular heart beat and near-syncope.  Cardiovascular risk factors: diabetes mellitus, hypertension and male gender. Use of agents associated with hypertension: none. History of target organ damage: none. BP Readings from Last 3 Encounters:  03/13/19 132/82  08/28/18 132/80  06/04/18 132/82   Prediabetes He is exercising by walking and also changing his diet and making better choices. He works at night but tries not to eat heavy food during the night. He denies any chest pains with exercise.  He is working on his weight so that he can prevent diabetes Wt Readings from Last 3 Encounters:  03/13/19 210 lb 12.8 oz (95.6 kg)  08/28/18 215 lb (97.5 kg)  05/07/18 217 lb (98.4 kg)   Hyperlipidemia He has controlled his cholesterol with diet and lifestyle He denies chest pain and claudication He denies any issues with dizziness He has cut back on red meat  Sore Throat He reports that he has a pain in his throat He denies fevers or chills He reports postnasal drip No body aches  Past Medical History:  Diagnosis Date  . Apnea   . Asthma   . Hyperlipidemia    mild  . Hypertension   . OSA on CPAP 07/30/2013  . Snoring     Past Surgical History:  Procedure Laterality Date  . COLONOSCOPY WITH PROPOFOL N/A 03/29/2018   Procedure: COLONOSCOPY WITH PROPOFOL;  Surgeon: Juanita Craver, MD;  Location: [facility];  Service: Endoscopy;  Laterality: N/A;  . DIRECT LARYNGOSCOPY  Right 06/06/2017   Procedure: MICRO DIRECT LARYNGOSCOPY WITH EXCISION OF VOCAL CORD MASS;  Surgeon: Leta Baptist, MD;  Location: [facility];  Service: ENT;  Laterality: Right;  . POLYPECTOMY  03/29/2018   Procedure: POLYPECTOMY;  Surgeon: Juanita Craver, MD;  Location: [facility];  Service: Endoscopy;;  . THROAT SURGERY     approx five years ago unsure    Family History  Problem Relation Age of Onset  . Hypertension Mother   . Hypertension Father   . Snoring Brother        all 3 brothers  . Snoring Sister        all 3 sisters    Social History   Socioeconomic History  . Marital status: Married    Spouse name: Not on file  . Number of children: Not on file  . Years of education: Not on file  . Highest education level: Not on file  Occupational History  . Not on file  Social Needs  . Financial resource strain: Not on file  . Food insecurity    Worry: Not on file    Inability: Not on file  . Transportation needs    Medical: Not on file    Non-medical: Not on file  Tobacco Use  . Smoking status: Never Smoker  . Smokeless tobacco: Never Used  Substance and Sexual Activity  . Alcohol use: Yes  . Drug use: No  . Sexual activity: Not on file  Lifestyle  . Physical activity    Days per week: Not on file    Minutes per session: Not on file  . Stress: Not on file  Relationships  . Social Herbalist on phone: Not on file    Gets together: Not on file    Attends religious service: Not on file    Active member of club or organization: Not on file    Attends meetings of clubs or organizations: Not on file    Relationship status: Not on file  . Intimate partner violence    Fear of current or ex partner: Not on file    Emotionally abused: Not on file    Physically abused: Not on file    Forced sexual activity: Not on file  Other Topics Concern  . Not on file  Social History Narrative  . Not on file    Outpatient Medications Prior to Visit   Medication Sig Dispense Refill  . amLODipine (NORVASC) 10 MG tablet TAKE 1 TABLET(10 MG) BY MOUTH DAILY 90 tablet 1  . losartan-hydrochlorothiazide (HYZAAR) 50-12.5 MG tablet TAKE 1 TABLET BY MOUTH DAILY 90 tablet 1  . metoprolol tartrate (LOPRESSOR) 50 MG tablet TAKE 1 TABLET(50 MG) BY MOUTH TWICE DAILY 180 tablet 1  . sertraline (ZOLOFT) 50 MG tablet TAKE 1 TABLET(50 MG) BY MOUTH DAILY 90 tablet 3  . triamcinolone cream (KENALOG) 0.1 % Apply 1 application topically 2 (two) times daily. 30 g 0  . potassium chloride (KLOR-CON) 20 MEQ packet Take 20 mEq by mouth daily. 30 packet 11  . LINZESS 290 MCG CAPS capsule TK 1 C PO QD  4   No facility-administered medications prior to visit.     No Known Allergies  ROS Review of Systems    Objective:    Physical Exam  BP 132/82 (BP Location: Right Arm, Patient Position: Sitting, Cuff Size: Large)   Pulse (!) 58   Temp 98.2 F (36.8 C) (Oral)   Resp 18   Ht 5' 6"  (1.676 m)   Wt 210 lb 12.8 oz (95.6 kg)   SpO2 100%   BMI 34.02 kg/m  Wt Readings from Last 3 Encounters:  03/13/19 210 lb 12.8 oz (95.6 kg)  08/28/18 215 lb (97.5 kg)  05/07/18 217 lb (98.4 kg)     Health Maintenance Due  Topic Date Due  . INFLUENZA VACCINE  01/26/2019    There are no preventive care reminders to display for this patient.  Lab Results  Component Value Date   TSH 0.517 01/09/2017   Lab Results  Component Value Date   WBC 4.7 01/09/2017   HGB 13.4 01/09/2017   HCT 42.0 01/09/2017   MCV 84 01/09/2017   PLT 152 01/09/2017   Lab Results  Component Value Date   NA 142 09/13/2018   K 3.3 (L) 09/13/2018   CO2 26 09/13/2018   GLUCOSE 106 (H) 09/13/2018   BUN 8 09/13/2018   CREATININE 0.88 09/13/2018   BILITOT 0.3 08/28/2018   ALKPHOS 109 08/28/2018   AST 27 08/28/2018   ALT 26 08/28/2018   PROT 7.7 08/28/2018   ALBUMIN 4.6 08/28/2018   CALCIUM 8.9 09/13/2018   ANIONGAP 5 06/02/2017   Lab Results  Component Value Date   CHOL 179  08/28/2018   Lab Results  Component Value Date   HDL 52 08/28/2018   Lab Results  Component Value Date   LDLCALC 107 (H) 08/28/2018   Lab Results  Component Value  Date   TRIG 98 08/28/2018   Lab Results  Component Value Date   CHOLHDL 3.4 08/28/2018   Lab Results  Component Value Date   HGBA1C 6.1 (A) 08/28/2018      Assessment & Plan:   Problem List Items Addressed This Visit    None    Visit Diagnoses    Prediabetes    -  Primary Should be improved  Pt is doing well with weight loss with diet and life style modification   Relevant Orders   CMP14+EGFR   Hemoglobin A1c   Lipid panel   Hypokalemia    - last K was 3.3  Now taking K supplementation  Will monitor   Relevant Medications   potassium chloride (KLOR-CON) 20 MEQ packet   Mixed hyperlipidemia    - stable    Relevant Orders   CMP14+EGFR   Lipid panel   Essential hypertension    -  Advised pt to continue current meds and his potaddium    Relevant Orders   CMP14+EGFR   Sore throat    -  Would recommend gargle and antihistamine      Meds ordered this encounter  Medications  . potassium chloride (KLOR-CON) 20 MEQ packet    Sig: Take 20 mEq by mouth daily.    Dispense:  30 packet    Refill:  11  . LINZESS 290 MCG CAPS capsule    Sig: TK 1 C PO QD    Dispense:  30 capsule    Refill:  4    Follow-up: No follow-ups on file.    Forrest Moron, MD

## 2019-03-13 NOTE — Patient Instructions (Signed)

## 2019-03-14 LAB — LIPID PANEL
Chol/HDL Ratio: 3.6 ratio (ref 0.0–5.0)
Cholesterol, Total: 208 mg/dL — ABNORMAL HIGH (ref 100–199)
HDL: 57 mg/dL (ref >39)
LDL Chol Calc (NIH): 129 mg/dL — ABNORMAL HIGH (ref 0–99)
Triglycerides: 123 mg/dL (ref 0–149)
VLDL Cholesterol Cal: 22 mg/dL (ref 5–40)

## 2019-03-14 LAB — CMP14+EGFR
ALT: 21 IU/L (ref 0–44)
AST: 26 IU/L (ref 0–40)
Albumin/Globulin Ratio: 1.4 (ref 1.2–2.2)
Albumin: 4.5 g/dL (ref 3.8–4.9)
Alkaline Phosphatase: 107 IU/L (ref 39–117)
BUN/Creatinine Ratio: 13 (ref 9–20)
BUN: 11 mg/dL (ref 6–24)
Bilirubin Total: 0.3 mg/dL (ref 0.0–1.2)
CO2: 28 mmol/L (ref 20–29)
Calcium: 9.3 mg/dL (ref 8.7–10.2)
Chloride: 99 mmol/L (ref 96–106)
Creatinine, Ser: 0.83 mg/dL (ref 0.76–1.27)
GFR calc Af Amer: 117 mL/min/{1.73_m2} (ref >59)
GFR calc non Af Amer: 101 mL/min/{1.73_m2} (ref >59)
Globulin, Total: 3.3 g/dL (ref 1.5–4.5)
Glucose: 81 mg/dL (ref 65–99)
Potassium: 3.3 mmol/L — ABNORMAL LOW (ref 3.5–5.2)
Sodium: 140 mmol/L (ref 134–144)
Total Protein: 7.8 g/dL (ref 6.0–8.5)

## 2019-03-14 LAB — HEMOGLOBIN A1C
Est. average glucose Bld gHb Est-mCnc: 131 mg/dL
Hgb A1c MFr Bld: 6.2 % — ABNORMAL HIGH (ref 4.8–5.6)

## 2019-03-15 ENCOUNTER — Encounter: Payer: Self-pay | Admitting: Radiology

## 2019-05-03 ENCOUNTER — Emergency Department (HOSPITAL_BASED_OUTPATIENT_CLINIC_OR_DEPARTMENT_OTHER)
Admission: EM | Admit: 2019-05-03 | Discharge: 2019-05-03 | Disposition: A | Discharge status: Home / Self Care | Payer: Managed Care, Other (non HMO) | Attending: Emergency Medicine | Admitting: Emergency Medicine

## 2019-05-03 ENCOUNTER — Emergency Department (HOSPITAL_BASED_OUTPATIENT_CLINIC_OR_DEPARTMENT_OTHER): Payer: Managed Care, Other (non HMO)

## 2019-05-03 ENCOUNTER — Other Ambulatory Visit: Payer: Self-pay

## 2019-05-03 ENCOUNTER — Encounter (HOSPITAL_BASED_OUTPATIENT_CLINIC_OR_DEPARTMENT_OTHER): Payer: Self-pay | Admitting: *Deleted

## 2019-05-03 DIAGNOSIS — E785 Hyperlipidemia, unspecified: Secondary | ICD-10-CM | POA: Diagnosis not present

## 2019-05-03 DIAGNOSIS — J45909 Unspecified asthma, uncomplicated: Secondary | ICD-10-CM | POA: Insufficient documentation

## 2019-05-03 DIAGNOSIS — I10 Essential (primary) hypertension: Secondary | ICD-10-CM | POA: Insufficient documentation

## 2019-05-03 DIAGNOSIS — M545 Low back pain: Secondary | ICD-10-CM | POA: Diagnosis present

## 2019-05-03 DIAGNOSIS — Z79899 Other long term (current) drug therapy: Secondary | ICD-10-CM | POA: Insufficient documentation

## 2019-05-03 MED ORDER — METHOCARBAMOL 500 MG PO TABS: 500.0000 mg | ORAL_TABLET | Freq: Three times a day (TID) | ORAL | 0 refills | Status: DC | PRN

## 2019-05-03 MED ORDER — NAPROXEN 500 MG PO TABS: 500.0000 mg | ORAL_TABLET | Freq: Two times a day (BID) | ORAL | 0 refills | Status: DC
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
## 2019-06-15 ENCOUNTER — Other Ambulatory Visit: Payer: Self-pay | Admitting: Family Medicine

## 2019-06-15 DIAGNOSIS — I10 Essential (primary) hypertension: Secondary | ICD-10-CM

## 2019-07-15 ENCOUNTER — Other Ambulatory Visit: Payer: Self-pay | Admitting: Family Medicine

## 2019-07-15 DIAGNOSIS — I10 Essential (primary) hypertension: Secondary | ICD-10-CM

## 2019-08-10 IMAGING — DX DG OS CALCIS 2+V*L*
2 series · 2 of 2 positions shown · non-contrast
Comparison: None.

CLINICAL DATA: Left heel pain for 2 weeks.  No known injury.

EXAM:
LEFT OS CALCIS - 2+ VIEW

[calcaneus axial]
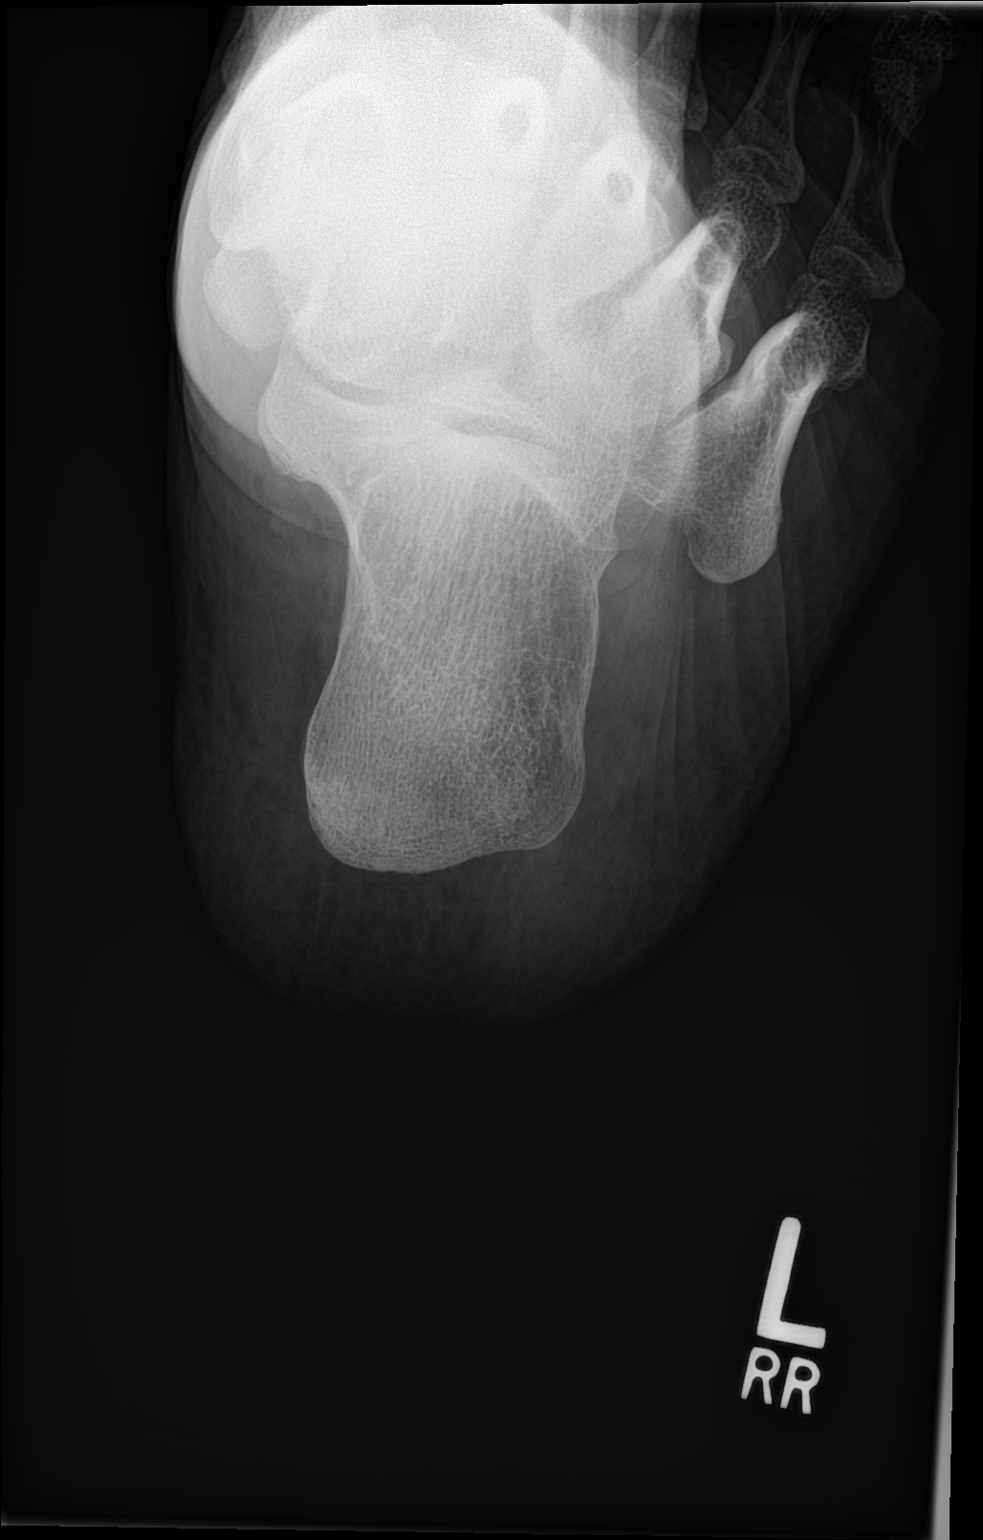

[calcaneus lat]
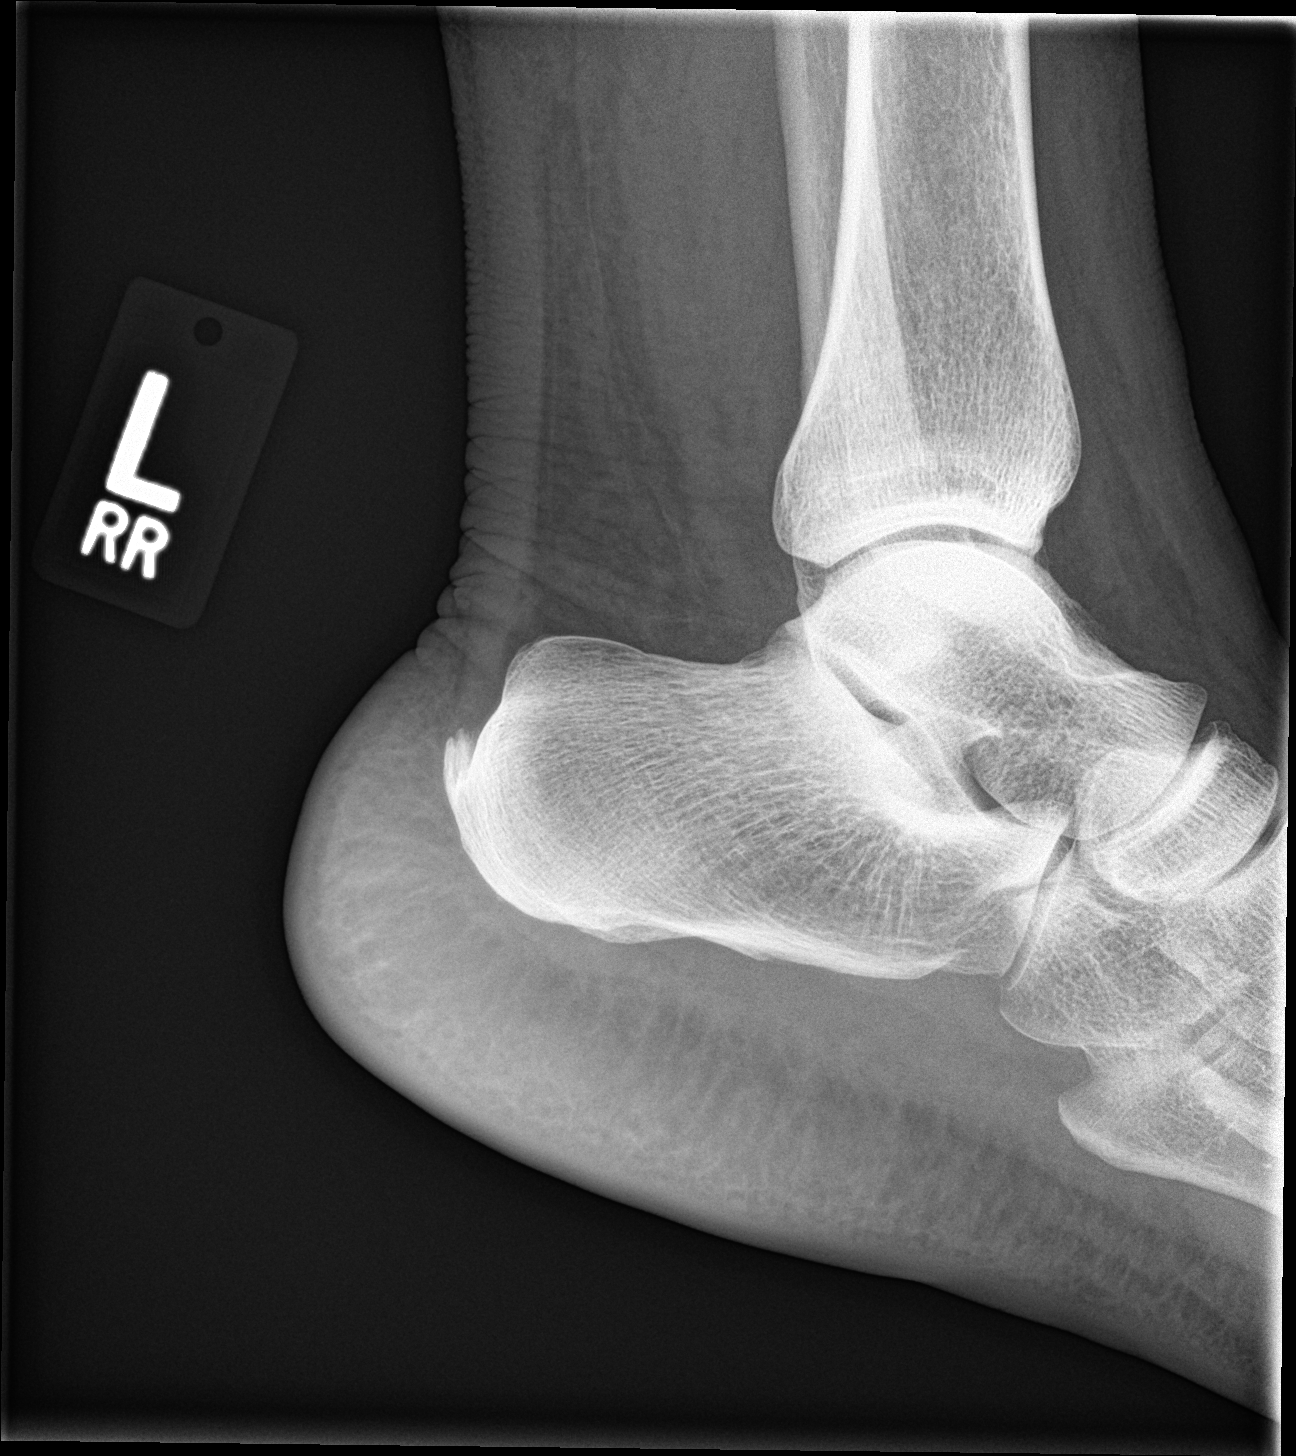

[2 of 2 positions shown; findings below may reference images not displayed]

FINDINGS: There is no evidence of fracture or other focal bone lesions. Mild
spurring at the Achilles tendon insertion on the calcaneus is noted.
Soft tissues are unremarkable.
IMPRESSION: Mild spurring at the Achilles tendon insertion on the calcaneus. The
study is otherwise unremarkable.

## 2019-08-30 ENCOUNTER — Other Ambulatory Visit: Payer: Self-pay

## 2019-08-30 ENCOUNTER — Telehealth: Payer: Self-pay

## 2019-08-30 ENCOUNTER — Telehealth: Payer: Self-pay | Admitting: Family Medicine

## 2019-08-30 DIAGNOSIS — I10 Essential (primary) hypertension: Secondary | ICD-10-CM

## 2019-08-30 DIAGNOSIS — F411 Generalized anxiety disorder: Secondary | ICD-10-CM

## 2019-08-30 MED ORDER — METOPROLOL TARTRATE 50 MG PO TABS: ORAL_TABLET | ORAL | 1 refills | Status: AC

## 2019-08-30 NOTE — Telephone Encounter (Signed)
Pt is requesting zoloft medication

## 2019-08-30 NOTE — Telephone Encounter (Signed)
Pt called regarding a med refill on metoprolol tartrate (LOPRESSOR) 50 MG tablet [id number]  sertraline (ZOLOFT) 50 MG tablet [id number] Pt has a appt on 3/30 Please advise.

## 2019-08-30 NOTE — Telephone Encounter (Signed)
Sent pt lopressor medication, sent message to pcp to fill the zoloft request

## 2019-08-30 NOTE — Telephone Encounter (Signed)
Lvm to return office call.  Pt requesting metoprolol 50 mg and zoloft 50 mg.  Metoprolol 50 mg #180 with 1 refill sent to pharmacy today.  Zoloft 50 mg last filled on 03/13/19 #90 with 3 refills.   No refill needed on zoloft as pt should have refills at the pharmacy.

## 2019-09-05 ENCOUNTER — Telehealth: Payer: Self-pay | Admitting: Family Medicine

## 2019-09-05 DIAGNOSIS — F411 Generalized anxiety disorder: Secondary | ICD-10-CM

## 2019-09-05 MED ORDER — SERTRALINE HCL 50 MG PO TABS: ORAL_TABLET | ORAL | 3 refills | Status: DC

## 2019-09-05 NOTE — Telephone Encounter (Signed)
What is the name of the medication? sertraline (ZOLOFT) 50 MG tablet [id number]. Pt would like a curtesy refill, we rescheduled his appointment from 3/16 to 3/30-he just needs enough to last till his upcoming appointment    Have you contacted your pharmacy to request a refill? yes  Which pharmacy would you like this sent to? Pharmacy  [facility] [place], [address]  [place], [place] [address]  Phone:  [phone] Fax:  [phone]       Patient notified that their request is being sent to the clinical staff for review and that they should receive a call once it is complete. If they do not receive a call within 72 hours they can check with their pharmacy or our office.

## 2019-09-06 NOTE — Addendum Note (Signed)
Addended by: Kittie Plater, Yotam Rhine HUA on: 09/06/2019 08:41 AM   Modules accepted: Orders

## 2019-09-06 NOTE — Telephone Encounter (Signed)
The rx of Zoloft medication was sent on yesterday

## 2019-09-09 ENCOUNTER — Telehealth: Payer: Self-pay | Admitting: Family Medicine

## 2019-09-09 NOTE — Telephone Encounter (Signed)
Called [facility] and clarified prescription no other action required

## 2019-09-09 NOTE — Telephone Encounter (Signed)
metoprolol tartrate (LOPRESSOR) 50 MG tablet [id number]  180 tablets / 90 day supply (typically for 2x times a day . ) just needs clarification on the directions on the medication    [phone]  Ref [id number]

## 2019-09-10 ENCOUNTER — Ambulatory Visit: Payer: Managed Care, Other (non HMO) | Admitting: Family Medicine

## 2019-09-24 ENCOUNTER — Other Ambulatory Visit: Payer: Self-pay

## 2019-09-24 ENCOUNTER — Ambulatory Visit: Payer: Managed Care, Other (non HMO) | Admitting: Family Medicine

## 2019-09-24 VITALS — BP 132/79 | HR 63 | Temp 97.8°F | Ht 66.0 in | Wt 219.8 lb

## 2019-09-24 DIAGNOSIS — M1712 Unilateral primary osteoarthritis, left knee: Secondary | ICD-10-CM

## 2019-09-24 DIAGNOSIS — F411 Generalized anxiety disorder: Secondary | ICD-10-CM | POA: Diagnosis not present

## 2019-09-24 DIAGNOSIS — F322 Major depressive disorder, single episode, severe without psychotic features: Secondary | ICD-10-CM

## 2019-09-24 DIAGNOSIS — R7303 Prediabetes: Secondary | ICD-10-CM

## 2019-09-24 DIAGNOSIS — I10 Essential (primary) hypertension: Secondary | ICD-10-CM | POA: Diagnosis not present

## 2019-09-24 MED ORDER — SERTRALINE HCL 50 MG PO TABS: 50.0000 mg | ORAL_TABLET | Freq: Every day | ORAL | 3 refills | Status: DC

## 2019-09-24 MED ORDER — SERTRALINE HCL 100 MG PO TABS: 100.0000 mg | ORAL_TABLET | Freq: Every day | ORAL | 3 refills | Status: AC

## 2019-09-24 NOTE — Patient Instructions (Addendum)
Dr. Joni Fears (Orthopedic Knee Doctor) - Phone: [phone]    If you have lab work done today you will be contacted with your lab results within the next 2 weeks.  If you have not heard from [place] then please contact us. The fastest way to get your results is to register for My Chart.   IF you received an x-ray today, you will receive an invoice from [facility]. Please contact [facility] at [phone] with questions or concerns regarding your invoice.   IF you received labwork today, you will receive an invoice from [place]. Please contact [facility] at [phone] with questions or concerns regarding your invoice.   Our billing staff will not be able to assist you with questions regarding bills from these companies.  You will be contacted with the lab results as soon as they are available. The fastest way to get your results is to activate your My Chart account. Instructions are located on the last page of this paperwork. If you have not heard from [place] regarding the results in 2 weeks, please contact this office.     Managing Your Hypertension Hypertension is commonly called high blood pressure. This is when the force of your blood pressing against the walls of your arteries is too strong. Arteries are blood vessels that carry blood from your heart throughout your body. Hypertension forces the heart to work harder to pump blood, and may cause the arteries to become narrow or stiff. Having untreated or uncontrolled hypertension can cause heart attack, stroke, kidney disease, and other problems. What are blood pressure readings? A blood pressure reading consists of a higher number over a lower number. Ideally, your blood pressure should be below 120/80. The first ("top") number is called the systolic pressure. It is a measure of the pressure in your arteries as your heart beats. The second ("bottom") number is called the diastolic pressure. It is a measure of the pressure  in your arteries as the heart relaxes. What does my blood pressure reading mean? Blood pressure is classified into four stages. Based on your blood pressure reading, your health care provider may use the following stages to determine what type of treatment you need, if any. Systolic pressure and diastolic pressure are measured in a unit called mm Hg. Normal  Systolic pressure: below 123456.  Diastolic pressure: below 80. Elevated  Systolic pressure: [id number].  Diastolic pressure: below 80. Hypertension stage 1  Systolic pressure: 0000000.  Diastolic pressure: [id number]. Hypertension stage 2  Systolic pressure: [id number] or above.  Diastolic pressure: 90 or above. What health risks are associated with hypertension? Managing your hypertension is an important responsibility. Uncontrolled hypertension can lead to:  A heart attack.  A stroke.  A weakened blood vessel (aneurysm).  Heart failure.  Kidney damage.  Eye damage.  Metabolic syndrome.  Memory and concentration problems. What changes can I make to manage my hypertension? Hypertension can be managed by making lifestyle changes and possibly by taking medicines. Your health care provider will help you make a plan to bring your blood pressure within a normal range. Eating and drinking   Eat a diet that is high in fiber and potassium, and low in salt (sodium), added sugar, and fat. An example eating plan is called the DASH (Dietary Approaches to Stop Hypertension) diet. To eat this way: ? Eat plenty of fresh fruits and vegetables. Try to fill half of your plate at each meal with fruits and vegetables. ? Eat whole grains, such as whole  wheat pasta, brown rice, or whole grain bread. Fill about one quarter of your plate with whole grains. ? Eat low-fat diary products. ? Avoid fatty cuts of meat, processed or cured meats, and poultry with skin. Fill about one quarter of your plate with lean proteins such as fish, chicken without skin,  beans, eggs, and tofu. ? Avoid premade and processed foods. These tend to be higher in sodium, added sugar, and fat.  Reduce your daily sodium intake. Most people with hypertension should eat less than 1,500 mg of sodium a day.  Limit alcohol intake to no more than 1 drink a day for nonpregnant women and 2 drinks a day for men. One drink equals 12 oz of beer, 5 oz of wine, or 1 oz of hard liquor. Lifestyle  Work with your health care provider to maintain a healthy body weight, or to lose weight. Ask what an ideal weight is for you.  Get at least 30 minutes of exercise that causes your heart to beat faster (aerobic exercise) most days of the week. Activities may include walking, swimming, or biking.  Include exercise to strengthen your muscles (resistance exercise), such as weight lifting, as part of your weekly exercise routine. Try to do these types of exercises for 30 minutes at least 3 days a week.  Do not use any products that contain nicotine or tobacco, such as cigarettes and e-cigarettes. If you need help quitting, ask your health care provider.  Control any long-term (chronic) conditions you have, such as high cholesterol or diabetes. Monitoring  Monitor your blood pressure at home as told by your health care provider. Your personal target blood pressure may vary depending on your medical conditions, your age, and other factors.  Have your blood pressure checked regularly, as often as told by your health care provider. Working with your health care provider  Review all the medicines you take with your health care provider because there may be side effects or interactions.  Talk with your health care provider about your diet, exercise habits, and other lifestyle factors that may be contributing to hypertension.  Visit your health care provider regularly. Your health care provider can help you create and adjust your plan for managing hypertension. Will I need medicine to control  my blood pressure? Your health care provider may prescribe medicine if lifestyle changes are not enough to get your blood pressure under control, and if:  Your systolic blood pressure is 130 or higher.  Your diastolic blood pressure is 80 or higher. Take medicines only as told by your health care provider. Follow the directions carefully. Blood pressure medicines must be taken as prescribed. The medicine does not work as well when you skip doses. Skipping doses also puts you at risk for problems. Contact a health care provider if:  You think you are having a reaction to medicines you have taken.  You have repeated (recurrent) headaches.  You feel dizzy.  You have swelling in your ankles.  You have trouble with your vision. Get help right away if:  You develop a severe headache or confusion.  You have unusual weakness or numbness, or you feel faint.  You have severe pain in your chest or abdomen.  You vomit repeatedly.  You have trouble breathing. Summary  Hypertension is when the force of blood pumping through your arteries is too strong. If this condition is not controlled, it may put you at risk for serious complications.  Your personal target blood pressure may vary  depending on your medical conditions, your age, and other factors. For most people, a normal blood pressure is less than 120/80.  Hypertension is managed by lifestyle changes, medicines, or both. Lifestyle changes include weight loss, eating a healthy, low-sodium diet, exercising more, and limiting alcohol. This information is not intended to replace advice given to you by your health care provider. Make sure you discuss any questions you have with your health care provider. Document Revised: 10/05/2018 Document Reviewed: 05/11/2016 Elsevier Patient Education  [place].

## 2019-09-24 NOTE — Progress Notes (Signed)
Established Patient Office Visit  Subjective:  Patient ID: Edward Erickson, male    DOB: 05/02/1966  Age: 54 y.o. MRN: [id number]  CC:  Chief Complaint  Patient presents with  . Follow-up    x6 mos on Hypertension    HPI [employer] presents for   Hypertension: Patient here for follow-up of elevated blood pressure. He is not exercising and is adherent to low salt diet.  Blood pressure is well controlled at home. Cardiac symptoms none. Patient denies chest pain, claudication, exertional chest pressure/discomfort and fatigue.  Cardiovascular risk factors: hypertension and obesity (BMI >= 30 kg/m2). Use of agents associated with hypertension: none. History of target organ damage: none. BP Readings from Last 3 Encounters:  09/24/19 132/79  05/03/19 130/84  03/13/19 132/82    Patient reports that he has knee pain which affects his exercise.  He went to Orthopedics for evaluation. He states that he gets knee pain to the front and the back of the knee on the left.   He is not certain that he would want any procedures.  Wt Readings from Last 3 Encounters:  09/24/19 219 lb 12.8 oz (99.7 kg)  05/03/19 192 lb (87.1 kg)  03/13/19 210 lb 12.8 oz (95.6 kg)    Prediabetes Lab Results  Component Value Date   HGBA1C 6.2 (H) 03/13/2019   Patient states that he is working on his carbohydrates. He cannot exercise due to his knee pain.  Anxiety and depression  Depression screen [facility] 2/9 09/24/2019 08/28/2018 01/11/2018 01/11/2018 10/16/2017  Decreased Interest 3 0 3 0 0  Down, Depressed, Hopeless 3 0 3 0 0  PHQ - 2 Score 6 0 6 0 0  Altered sleeping 3 - 0 - -  Tired, decreased energy 3 - 0 - -  Change in appetite 3 - 1 - -  Feeling bad or failure about yourself  3 - 1 - -  Trouble concentrating 3 - 0 - -  Moving slowly or fidgety/restless 3 - 2 - -  Suicidal thoughts 3 - 0 - -  PHQ-9 Score 27 - 10 - -  Difficult doing work/chores Not difficult at all - Somewhat difficult - -    GAD 7 : Generalized Anxiety Score 09/24/2019  Nervous, Anxious, on Edge 3  Control/stop worrying 2  Worry too much - different things 2  Trouble relaxing 3  Restless 3  Easily annoyed or irritable 2  Afraid - awful might happen 2  Total GAD 7 Score 17  Anxiety Difficulty Not difficult at all     He states that he is stable on his zoloft.  He states that sometimes he feels very depressed.  He denies any suicidal thoughts He reports that he exercises "self-control" and can sit down in a quiet place and does not feel like talking.  He states that he people around him notice that he gets quiet in his mood.  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  He was a Pharmacist, hospital in [place].  Past Medical History:  Diagnosis Date  . Apnea   . Asthma   . Hyperlipidemia    mild  . Hypertension   . OSA on CPAP 07/30/2013  . Snoring     Past Surgical History:  Procedure Laterality Date  . COLONOSCOPY WITH PROPOFOL N/A 03/29/2018   Procedure: COLONOSCOPY WITH PROPOFOL;  Surgeon: Juanita Craver, MD;  Location: [facility];  Service: Endoscopy;  Laterality: N/A;  . DIRECT LARYNGOSCOPY Right 06/06/2017  Procedure: MICRO DIRECT LARYNGOSCOPY WITH EXCISION OF VOCAL CORD MASS;  Surgeon: Leta Baptist, MD;  Location: [facility];  Service: ENT;  Laterality: Right;  . POLYPECTOMY  03/29/2018   Procedure: POLYPECTOMY;  Surgeon: Juanita Craver, MD;  Location: [facility];  Service: Endoscopy;;  . THROAT SURGERY     approx five years ago unsure    Family History  Problem Relation Age of Onset  . Hypertension Mother   . Hypertension Father   . Snoring Brother        all 3 brothers  . Snoring Sister        all 3 sisters    Social History   Socioeconomic History  . Marital status: Married    Spouse name: Not on file  . Number of children: Not on file  . Years of education: Not on file  . Highest education level: Not on file  Occupational History  . Not on file  Tobacco Use  . Smoking  status: Never Smoker  . Smokeless tobacco: Never Used  Substance and Sexual Activity  . Alcohol use: Yes  . Drug use: No  . Sexual activity: Not on file  Other Topics Concern  . Not on file  Social History Narrative  . Not on file   Social Determinants of Health   Financial Resource Strain:   . Difficulty of Paying Living Expenses:   Food Insecurity:   . Worried About Charity fundraiser in the Last Year:   . Arboriculturist in the Last Year:   Transportation Needs:   . Film/video editor (Medical):   Marland Kitchen Lack of Transportation (Non-Medical):   Physical Activity:   . Days of Exercise per Week:   . Minutes of Exercise per Session:   Stress:   . Feeling of Stress :   Social Connections:   . Frequency of Communication with Friends and Family:   . Frequency of Social Gatherings with Friends and Family:   . Attends Religious Services:   . Active Member of Clubs or Organizations:   . Attends Archivist Meetings:   Marland Kitchen Marital Status:   Intimate Partner Violence:   . Fear of Current or Ex-Partner:   . Emotionally Abused:   Marland Kitchen Physically Abused:   . Sexually Abused:     Outpatient Medications Prior to Visit  Medication Sig Dispense Refill  . amLODipine (NORVASC) 10 MG tablet TAKE 1 TABLET(10 MG) BY MOUTH DAILY 90 tablet 1  . cetirizine (ZYRTEC) 10 MG tablet Take 1 tablet (10 mg total) by mouth daily. 30 tablet 11  . LINZESS 290 MCG CAPS capsule TK 1 C PO QD 30 capsule 4  . losartan-hydrochlorothiazide (HYZAAR) 50-12.5 MG tablet TAKE 1 TABLET BY MOUTH DAILY 90 tablet 1  . methocarbamol (ROBAXIN) 500 MG tablet Take 1 tablet (500 mg total) by mouth every 8 (eight) hours as needed. 15 tablet 0  . metoprolol tartrate (LOPRESSOR) 50 MG tablet Take one tablet by mouth daily 180 tablet 1  . naproxen (NAPROSYN) 500 MG tablet Take 1 tablet (500 mg total) by mouth 2 (two) times daily. 10 tablet 0  . potassium chloride (KLOR-CON) 20 MEQ packet Take 20 mEq by mouth daily. 30  packet 11  . triamcinolone cream (KENALOG) 0.1 % Apply 1 application topically 2 (two) times daily. 30 g 0  . sertraline (ZOLOFT) 50 MG tablet Take 50 mg by mouth daily.     No facility-administered medications prior to visit.  No Known Allergies  ROS Review of Systems Review of Systems  Constitutional: Negative for activity change, appetite change, chills and fever.  HENT: Negative for congestion, nosebleeds, trouble swallowing and voice change.   Respiratory: Negative for cough, shortness of breath and wheezing.   Gastrointestinal: Negative for diarrhea, nausea and vomiting.  Genitourinary: Negative for difficulty urinating, dysuria, flank pain and hematuria.  Musculoskeletal: see hpi Neurological: Negative for dizziness, speech difficulty, light-headedness and numbness.  See HPI. All other review of systems negative.     Objective:    Physical Exam  BP 132/79 (BP Location: Right Arm, Patient Position: Sitting, Cuff Size: Large)   Pulse 63   Temp 97.8 F (36.6 C) (Temporal)   Ht '5\' 6"'$  (1.676 m)   Wt 219 lb 12.8 oz (99.7 kg)   SpO2 98%   BMI 35.48 kg/m  Wt Readings from Last 3 Encounters:  09/24/19 219 lb 12.8 oz (99.7 kg)  05/03/19 192 lb (87.1 kg)  03/13/19 210 lb 12.8 oz (95.6 kg)   Physical Exam  Constitutional: Oriented to person, place, and time. Appears well-developed and well-nourished.  HENT:  Head: Normocephalic and atraumatic.  Eyes: Conjunctivae and EOM are normal.  Cardiovascular: Normal rate, regular rhythm, normal heart sounds and intact distal pulses.  No murmur heard. Pulmonary/Chest: Effort normal and breath sounds normal. No stridor. No respiratory distress. Has no wheezes.  Neurological: Is alert and oriented to person, place, and time.  Skin: Skin is warm. Capillary refill takes less than 2 seconds.  Psychiatric: Has a normal mood and affect. Behavior is normal. Judgment and thought content normal.    Health Maintenance Due  Topic Date Due   . INFLUENZA VACCINE  01/26/2019    There are no preventive care reminders to display for this patient.  Lab Results  Component Value Date   TSH 0.517 01/09/2017   Lab Results  Component Value Date   WBC 4.7 01/09/2017   HGB 13.4 01/09/2017   HCT 42.0 01/09/2017   MCV 84 01/09/2017   PLT 152 01/09/2017   Lab Results  Component Value Date   NA 140 03/13/2019   K 3.3 (L) 03/13/2019   CO2 28 03/13/2019   GLUCOSE 81 03/13/2019   BUN 11 03/13/2019   CREATININE 0.83 03/13/2019   BILITOT 0.3 03/13/2019   ALKPHOS 107 03/13/2019   AST 26 03/13/2019   ALT 21 03/13/2019   PROT 7.8 03/13/2019   ALBUMIN 4.5 03/13/2019   CALCIUM 9.3 03/13/2019   ANIONGAP 5 06/02/2017   Lab Results  Component Value Date   CHOL 208 (H) 03/13/2019   Lab Results  Component Value Date   HDL 57 03/13/2019   Lab Results  Component Value Date   LDLCALC 129 (H) 03/13/2019   Lab Results  Component Value Date   TRIG 123 03/13/2019   Lab Results  Component Value Date   CHOLHDL 3.6 03/13/2019   Lab Results  Component Value Date   HGBA1C 6.2 (H) 03/13/2019      Assessment & Plan:   Problem List Items Addressed This Visit    None    Visit Diagnoses    Essential hypertension    -  Primary Patient's blood pressure is at goal of 139/89 or less. Condition is stable. Continue current medications and treatment plan. I recommend that you exercise for 30-45 minutes 5 days a week. I also recommend a balanced diet with fruits and vegetables every day, lean meats, and little fried foods. The DASH diet (  you can find this online) is a good example of this.    Relevant Orders   CMP14+EGFR   Lipid panel   Microalbumin, urine   Generalized anxiety disorder    -  Increased zoloft from 49m to 1067m  Relevant Medications   sertraline (ZOLOFT) 100 MG tablet   Prediabetes    -  Will monitor   Relevant Orders   Hemoglobin A1c   Primary osteoarthritis of left knee    -  Discussed follow up with  Orthopedics, discussed orthoscopic surgery   Severe depression (HCC)       Relevant Medications   sertraline (ZOLOFT) 100 MG tablet      Meds ordered this encounter  Medications  . DISCONTD: sertraline (ZOLOFT) 50 MG tablet    Sig: Take 1 tablet (50 mg total) by mouth daily.    Dispense:  90 tablet    Refill:  3  . sertraline (ZOLOFT) 100 MG tablet    Sig: Take 1 tablet (100 mg total) by mouth daily.    Dispense:  90 tablet    Refill:  3    Increased dose.  Please discard previous 5045mose    Follow-up: No follow-ups on file.    ZoeForrest MoronD

## 2019-09-25 LAB — CMP14+EGFR
ALT: 24 IU/L (ref 0–44)
AST: 27 IU/L (ref 0–40)
Albumin/Globulin Ratio: 1.2 (ref 1.2–2.2)
Albumin: 4.2 g/dL (ref 3.8–4.9)
Alkaline Phosphatase: 120 IU/L — ABNORMAL HIGH (ref 39–117)
BUN/Creatinine Ratio: 10 (ref 9–20)
BUN: 9 mg/dL (ref 6–24)
Bilirubin Total: 0.3 mg/dL (ref 0.0–1.2)
CO2: 25 mmol/L (ref 20–29)
Calcium: 9 mg/dL (ref 8.7–10.2)
Chloride: 101 mmol/L (ref 96–106)
Creatinine, Ser: 0.87 mg/dL (ref 0.76–1.27)
GFR calc Af Amer: 114 mL/min/{1.73_m2} (ref >59)
GFR calc non Af Amer: 99 mL/min/{1.73_m2} (ref >59)
Globulin, Total: 3.5 g/dL (ref 1.5–4.5)
Glucose: 84 mg/dL (ref 65–99)
Potassium: 3.7 mmol/L (ref 3.5–5.2)
Sodium: 142 mmol/L (ref 134–144)
Total Protein: 7.7 g/dL (ref 6.0–8.5)

## 2019-09-25 LAB — LIPID PANEL
Chol/HDL Ratio: 4.1 ratio (ref 0.0–5.0)
Cholesterol, Total: 197 mg/dL (ref 100–199)
HDL: 48 mg/dL (ref >39)
LDL Chol Calc (NIH): 120 mg/dL — ABNORMAL HIGH (ref 0–99)
Triglycerides: 165 mg/dL — ABNORMAL HIGH (ref 0–149)
VLDL Cholesterol Cal: 29 mg/dL (ref 5–40)

## 2019-09-25 LAB — MICROALBUMIN, URINE: Microalbumin, Urine: 5.7 ug/mL

## 2019-09-25 LAB — HEMOGLOBIN A1C
Est. average glucose Bld gHb Est-mCnc: 137 mg/dL
Hgb A1c MFr Bld: 6.4 % — ABNORMAL HIGH (ref 4.8–5.6)

## 2019-09-30 ENCOUNTER — Encounter: Payer: Self-pay | Admitting: Radiology

## 2019-10-22 ENCOUNTER — Other Ambulatory Visit: Payer: Self-pay

## 2019-10-22 ENCOUNTER — Ambulatory Visit: Payer: Managed Care, Other (non HMO) | Admitting: Family Medicine

## 2019-10-22 VITALS — BP 132/82 | HR 72 | Temp 97.9°F | Resp 15 | Ht 66.0 in | Wt 212.8 lb

## 2019-10-22 DIAGNOSIS — F411 Generalized anxiety disorder: Secondary | ICD-10-CM

## 2019-10-22 DIAGNOSIS — R7303 Prediabetes: Secondary | ICD-10-CM | POA: Diagnosis not present

## 2019-10-22 NOTE — Patient Instructions (Signed)
° ° ° °  If you have lab work done today you will be contacted with your lab results within the next 2 weeks.  If you have not heard from us then please contact us. The fastest way to get your results is to register for My Chart. ° ° °IF you received an x-ray today, you will receive an invoice from [facility]. Please contact  Radiology at [phone] with questions or concerns regarding your invoice.  ° °IF you received labwork today, you will receive an invoice from [facility]. Please contact [facility] at [phone] with questions or concerns regarding your invoice.  ° °Our billing staff will not be able to assist you with questions regarding bills from these companies. ° °You will be contacted with the lab results as soon as they are available. The fastest way to get your results is to activate your My Chart account. Instructions are located on the last page of this paperwork. If you have not heard from us regarding the results in 2 weeks, please contact this office. °  ° ° ° °

## 2019-10-22 NOTE — Progress Notes (Signed)
Established Patient Office Visit  Subjective:  Patient ID: Edward Erickson, male    DOB: 1965/10/26  Age: 54 y.o. MRN: [id number]  CC:  Chief Complaint  Patient presents with  . Anxiety    pt notes he gets "stressed out" at work on occasion   . 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   Depression screen [facility] 2/9 10/22/2019 10/22/2019 09/24/2019 08/28/2018 01/11/2018  Decreased Interest 1 2 3  0 3  Down, Depressed, Hopeless 1 1 3  0 3  PHQ - 2 Score 2 3 6  0 6  Altered sleeping 2 1 3  - 0  Tired, decreased energy - 2 3 - 0  Change in appetite 1 1 3  - 1  Feeling bad or failure about yourself  2 1 3  - 1  Trouble concentrating 1 1 3  - 0  Moving slowly or fidgety/restless 1 1 3  - 2  Suicidal thoughts 0 0 3 - 0  PHQ-9 Score 9 10 27  - 10  Difficult doing work/chores Somewhat difficult - Not difficult at all - Somewhat difficult    Prediabetes Patient states that he is exercising to bring down his sugar He is eating salads in the morning He drinks tea without sugar to help his prediabetes Wt Readings from Last 3 Encounters:  10/22/19 212 lb 12.8 oz (96.5 kg)  09/24/19 219 lb 12.8 oz (99.7 kg)  05/03/19 192 lb (87.1 kg)    Past Medical History:  Diagnosis Date  . Apnea   . Asthma   . Hyperlipidemia    mild  . Hypertension   . OSA on CPAP 07/30/2013  . Snoring     Past Surgical History:  Procedure Laterality Date  . COLONOSCOPY WITH PROPOFOL N/A 03/29/2018   Procedure: COLONOSCOPY WITH PROPOFOL;  Surgeon: Juanita Craver, MD;  Location: [facility];  Service: Endoscopy;  Laterality: N/A;  .  DIRECT LARYNGOSCOPY Right 06/06/2017   Procedure: MICRO DIRECT LARYNGOSCOPY WITH EXCISION OF VOCAL CORD MASS;  Surgeon: Leta Baptist, MD;  Location: [facility];  Service: ENT;  Laterality: Right;  . POLYPECTOMY  03/29/2018   Procedure: POLYPECTOMY;  Surgeon: Juanita Craver, MD;  Location: [facility];  Service: Endoscopy;;  . THROAT SURGERY     approx five years ago unsure    Family History  Problem Relation Age of Onset  . Hypertension Mother   . Hypertension Father   . Snoring Brother        all 3 brothers  . Snoring Sister        all 3 sisters    Social History   Socioeconomic History  . Marital status: Married    Spouse name: Not on file  . Number of children: Not on file  . Years of education: Not on file  . Highest education level: Not on file  Occupational History  . Not on file  Tobacco Use  . Smoking status: Never Smoker  . Smokeless tobacco: Never Used  Substance and Sexual Activity  . Alcohol use: Yes  .  Drug use: No  . Sexual activity: Not on file  Other Topics Concern  . Not on file  Social History Narrative  . Not on file   Social Determinants of Health   Financial Resource Strain:   . Difficulty of Paying Living Expenses:   Food Insecurity:   . Worried About Charity fundraiser in the Last Year:   . Arboriculturist in the Last Year:   Transportation Needs:   . Film/video editor (Medical):   Marland Kitchen Lack of Transportation (Non-Medical):   Physical Activity:   . Days of Exercise per Week:   . Minutes of Exercise per Session:   Stress:   . Feeling of Stress :   Social Connections:   . Frequency of Communication with Friends and Family:   . Frequency of Social Gatherings with Friends and Family:   . Attends Religious Services:   . Active Member of Clubs or Organizations:   . Attends Archivist Meetings:   Marland Kitchen Marital Status:   Intimate Partner Violence:   . Fear of Current or Ex-Partner:   . Emotionally Abused:   Marland Kitchen Physically  Abused:   . Sexually Abused:     Outpatient Medications Prior to Visit  Medication Sig Dispense Refill  . amLODipine (NORVASC) 10 MG tablet TAKE 1 TABLET(10 MG) BY MOUTH DAILY 90 tablet 1  . cetirizine (ZYRTEC) 10 MG tablet Take 1 tablet (10 mg total) by mouth daily. 30 tablet 11  . LINZESS 290 MCG CAPS capsule TK 1 C PO QD 30 capsule 4  . losartan-hydrochlorothiazide (HYZAAR) 50-12.5 MG tablet TAKE 1 TABLET BY MOUTH DAILY 90 tablet 1  . methocarbamol (ROBAXIN) 500 MG tablet Take 1 tablet (500 mg total) by mouth every 8 (eight) hours as needed. 15 tablet 0  . metoprolol tartrate (LOPRESSOR) 50 MG tablet Take one tablet by mouth daily 180 tablet 1  . naproxen (NAPROSYN) 500 MG tablet Take 1 tablet (500 mg total) by mouth 2 (two) times daily. 10 tablet 0  . potassium chloride (KLOR-CON) 20 MEQ packet Take 20 mEq by mouth daily. 30 packet 11  . sertraline (ZOLOFT) 100 MG tablet Take 1 tablet (100 mg total) by mouth daily. 90 tablet 3  . triamcinolone cream (KENALOG) 0.1 % Apply 1 application topically 2 (two) times daily. 30 g 0   No facility-administered medications prior to visit.    No Known Allergies  ROS Review of Systems Review of Systems  Constitutional: Negative for activity change, appetite change, chills and fever.  HENT: Negative for congestion, nosebleeds, trouble swallowing and voice change.   Respiratory: Negative for cough, shortness of breath and wheezing.   Gastrointestinal: Negative for diarrhea, nausea and vomiting.  Genitourinary: Negative for difficulty urinating, dysuria, flank pain and hematuria.  Musculoskeletal: Negative for back pain, joint swelling and neck pain.  Neurological: Negative for dizziness, speech difficulty, light-headedness and numbness.  See HPI. All other review of systems negative.     Objective:    Physical Exam  BP 132/82   Pulse 72   Temp 97.9 F (36.6 C) (Temporal)   Resp 15   Ht 5\' 6"  (1.676 m)   Wt 212 lb 12.8 oz (96.5 kg)    SpO2 98%   BMI 34.35 kg/m  Wt Readings from Last 3 Encounters:  10/22/19 212 lb 12.8 oz (96.5 kg)  09/24/19 219 lb 12.8 oz (99.7 kg)  05/03/19 192 lb (87.1 kg)   Physical Exam  Constitutional: Oriented to  person, place, and time. Appears well-developed and well-nourished.  HENT:  Head: Normocephalic and atraumatic.  Eyes: Conjunctivae and EOM are normal.  Cardiovascular: Normal rate, regular rhythm, normal heart sounds and intact distal pulses.  No murmur heard. Pulmonary/Chest: Effort normal and breath sounds normal. No stridor. No respiratory distress. Has no wheezes.  Neurological: Is alert and oriented to person, place, and time.  Skin: Skin is warm. Capillary refill takes less than 2 seconds.  Psychiatric: Has a normal mood and affect. Behavior is normal. Judgment and thought content normal.    There are no preventive care reminders to display for this patient.  There are no preventive care reminders to display for this patient.  Lab Results  Component Value Date   TSH 0.517 01/09/2017   Lab Results  Component Value Date   WBC 4.7 01/09/2017   HGB 13.4 01/09/2017   HCT 42.0 01/09/2017   MCV 84 01/09/2017   PLT 152 01/09/2017   Lab Results  Component Value Date   NA 142 09/24/2019   K 3.7 09/24/2019   CO2 25 09/24/2019   GLUCOSE 84 09/24/2019   BUN 9 09/24/2019   CREATININE 0.87 09/24/2019   BILITOT 0.3 09/24/2019   ALKPHOS 120 (H) 09/24/2019   AST 27 09/24/2019   ALT 24 09/24/2019   PROT 7.7 09/24/2019   ALBUMIN 4.2 09/24/2019   CALCIUM 9.0 09/24/2019   ANIONGAP 5 06/02/2017   Lab Results  Component Value Date   CHOL 197 09/24/2019   Lab Results  Component Value Date   HDL 48 09/24/2019   Lab Results  Component Value Date   LDLCALC 120 (H) 09/24/2019   Lab Results  Component Value Date   TRIG 165 (H) 09/24/2019   Lab Results  Component Value Date   CHOLHDL 4.1 09/24/2019   Lab Results  Component Value Date   HGBA1C 6.4 (H) 09/24/2019       Assessment & Plan:   Problem List Items Addressed This Visit    None    Visit Diagnoses    Prediabetes    -  Primary   Generalized anxiety disorder         Discussed with patient and his daughter than he should continue his activities as safely as possible. Discussed traveling and advised pt get a change of scenery.  Continue diabetes prevention efforts.   No orders of the defined types were placed in this encounter.   Follow-up: Return in about 3 months (around 01/21/2020) for prediabetes, a1c check with [facility].    Forrest Moron, MD

## 2020-01-03 ENCOUNTER — Other Ambulatory Visit: Payer: Self-pay

## 2020-01-03 DIAGNOSIS — I10 Essential (primary) hypertension: Secondary | ICD-10-CM

## 2020-01-03 MED ORDER — LOSARTAN POTASSIUM-HCTZ 50-12.5 MG PO TABS: 1.0000 | ORAL_TABLET | Freq: Every day | ORAL | 0 refills | Status: DC

## 2020-05-13 ENCOUNTER — Other Ambulatory Visit: Payer: Self-pay | Admitting: *Deleted

## 2020-05-13 DIAGNOSIS — I10 Essential (primary) hypertension: Secondary | ICD-10-CM

## 2020-05-13 MED ORDER — LOSARTAN POTASSIUM-HCTZ 50-12.5 MG PO TABS: 1.0000 | ORAL_TABLET | Freq: Every day | ORAL | 0 refills | Status: DC

## 2020-08-10 ENCOUNTER — Telehealth: Payer: Self-pay | Admitting: *Deleted

## 2020-08-10 ENCOUNTER — Other Ambulatory Visit: Payer: Self-pay | Admitting: Emergency Medicine

## 2020-08-10 ENCOUNTER — Other Ambulatory Visit: Payer: Self-pay | Admitting: *Deleted

## 2020-08-10 DIAGNOSIS — I10 Essential (primary) hypertension: Secondary | ICD-10-CM

## 2020-08-10 MED ORDER — AMLODIPINE BESYLATE 10 MG PO TABS: ORAL_TABLET | ORAL | 0 refills | Status: AC

## 2020-08-10 MED ORDER — LOSARTAN POTASSIUM-HCTZ 50-12.5 MG PO TABS: 1.0000 | ORAL_TABLET | Freq: Every day | ORAL | 0 refills | Status: AC

## 2020-08-10 NOTE — Telephone Encounter (Signed)
Requested medication (s) are due for refill today:   Yes  Requested medication (s) are on the active medication list:   Yes  Future visit scheduled:   No   Last ordered: 05/13/2020 #90, 0 refills as a courtesy refill by Dr. Mitchel Honour.  Clinic note:  Pt needs an appt for further refills since the courtesy refill was given on 11/17.   LOV 10/22/2019 with Dr. Nolon Rod.  [facility] does not schedule for this practice.   Requested Prescriptions  Pending Prescriptions Disp Refills   losartan-hydrochlorothiazide (HYZAAR) 50-12.5 MG tablet [Pharmacy Med Name: LOSARTAN/HCTZ 50/12.5MG  TABLETS] 90 tablet 0    Sig: Take 1 tablet by mouth daily.      Cardiovascular: ARB + Diuretic Combos Failed - 08/10/2020  7:01 AM      Failed - K in normal range and within 180 days    Potassium  Date Value Ref Range Status  09/24/2019 3.7 3.5 - 5.2 mmol/L Final          Failed - Na in normal range and within 180 days    Sodium  Date Value Ref Range Status  09/24/2019 142 134 - 144 mmol/L Final          Failed - Cr in normal range and within 180 days    Creat  Date Value Ref Range Status  03/01/2016 0.85 0.60 - 1.35 mg/dL Final   Creatinine, Ser  Date Value Ref Range Status  09/24/2019 0.87 0.76 - 1.27 mg/dL Final          Failed - Ca in normal range and within 180 days    Calcium  Date Value Ref Range Status  09/24/2019 9.0 8.7 - 10.2 mg/dL Final          Failed - Valid encounter within last 6 months    Recent Outpatient Visits           9 months ago Prediabetes   [facility] at [facility], Arlie Solomons, MD   10 months ago Essential hypertension   [facility] at [facility], Arlie Solomons, MD   1 year ago Prediabetes   [facility] at [facility], Arlie Solomons, MD   1 year ago Hypokalemia   [facility] at [facility], Arlie Solomons, MD   1 year ago Prediabetes   [facility] at [place], MD                Passed - Patient is not pregnant      Passed - Last BP in normal  range    BP Readings from Last 1 Encounters:  10/22/19 132/82

## 2020-08-10 NOTE — Telephone Encounter (Signed)
Called [facility] to refill Losartan-Hctz 50-12.5 mg spoke to [facility] the pharmacist. This is a second Courtesy refill, patient needs to schedule an appointment.

## 2020-10-13 ENCOUNTER — Other Ambulatory Visit: Payer: Self-pay

## 2020-10-13 ENCOUNTER — Ambulatory Visit: Payer: Managed Care, Other (non HMO) | Admitting: Neurology

## 2020-10-13 ENCOUNTER — Encounter: Payer: Self-pay | Admitting: Neurology

## 2020-10-13 VITALS — BP 125/79 | HR 63 | Ht 66.0 in | Wt 218.0 lb

## 2020-10-13 DIAGNOSIS — Z9989 Dependence on other enabling machines and devices: Secondary | ICD-10-CM | POA: Diagnosis not present

## 2020-10-13 DIAGNOSIS — G4733 Obstructive sleep apnea (adult) (pediatric): Secondary | ICD-10-CM | POA: Diagnosis not present

## 2020-10-13 NOTE — Patient Instructions (Signed)
It was good to see you again today.  You are compliant with your CPAP machine.  We will try to get you a new machine as this machine is over 55 years old.  If possible, I will write for a new machine through your previous DME (durable medical equipment) company.  Sometimes insurances require an updated sleep study.  If that is the case, we will contact you and we will do a home sleep test.  Please remember that once you start a new machine even if the settings are the same as the old machine, we will have to see you back within 3 months after starting new equipment.  After that, if all goes well, we will see you once a year.  Please contact us once you have possession of the new machine.  We will schedule an appointment in this clinic accordingly.

## 2020-10-13 NOTE — Progress Notes (Signed)
IsSubjective:    Patient ID: Edward Erickson is a 55 y.o. male.  HPI     Star Age, MD, PhD [facility] [address], [address] [place], [place] [address]  Dear Drs. Shackleford and Mission,   I saw your patient, Edward Erickson, at your kind request in my sleep clinic today for reevaluation of his obstructive sleep apnea.  The patient is unaccompanied today.  As you know, Edward Erickson is a 55 year old right-handed gentleman with an underlying medical history of obesity, chronic constipation, anxiety, hypertension, asthma, and hyperlipidemia, who reports doing well with CPAP therapy.  He was previously diagnosed with obstructive sleep apnea.  He had sleep testing through our clinic in February 2015, baseline AHI was 66.5 at the time, he was advised to start CPAP at 8 cm.  He has been lost to follow-up since 2015, he missed an appointment over 04/16/2014.  He was originally diagnosed with obstructive sleep apnea in 2011 and had an order CPAP machine which was set at 14 cm.  I reviewed her office note from 05/15/2020, at which time he saw Dr. Nolon Rod.  His Epworth sleepiness score is 2 out of 24.  He reports compliance with his current machine, which is over 55 years old.  He would like to get new equipment.  I reviewed his compliance data from 09/03/2020 through 10/02/2020, which is a total of 30 days, during which time he used his machine every night with percent use days greater than 4 hours at 87%, indicating very good compliance with an average usage of 7 hours and 33 minutes, residual AHI at goal at 1.7/h, leak on the low side with a 95th percentile at 6.5 L/min on a pressure of 8 cm without EPR.  He reports feeling well with regards to his sleep quality and sleep consolidation.  In fact, he feels like he cannot sleep without his CPAP.  He has seen an error message on his machine recently stating that the motor life has exceeded.  He uses a nasal mask.  He  works nights, he works for a [employer], production.  He typically works from 7 PM to 7 AM.  He lives with his wife, his 55 year old son is currently staying with them, planning to go to med school.  His 55 year old daughter is in the [employer].  He is a non-smoker and drinks alcohol occasionally.  He drinks caffeine in the form of hot tea, 1 large cup per day.  He does not have recurrent headaches but does get up to go to the bathroom once or twice once he goes to bed.  Typical bedtime is between 9 and 9:30 AM.  He drops off his wife to work after he comes home from work which is around 7:30 AM.  If he has to pick her up he wakes up at 3:30 PM, if he does not have to pick her up he will sleep till 5 PM.  Previously:   I saw him on 10/14/2013, at which time he reported doing well with his new CPAP. He had adjusted well and was sleeping better. She was compliant with treatment. He likes his new machine better than the old one.  I reviewed his reviewed his compliance data from 01/16/14 to 04/14/14, which is a total of 89 days, during which time he used his CPAP every night. AHI was 1.1/h, average usage of 6:33 and leak acceptable. Compliance percentage of 83%. Pressure at 8 cm, EPR off.  I  first met him on 07/30/2013, at which time he reported a diagnosis of severe obstructive sleep apnea in 2011. He has been using CPAP since then. He reported significant weight gain since his last sleep study and need for new supplies. I suggested he start using Flonase for significantly tight nasal passages and asked him to return for a sleep study. He had a split-night sleep study on 08/08/2013 and I went over her sleep study results with him in detail today. His baseline sleep efficiency was 55% with a latency to sleep of 13.5 minutes and wake after sleep onset of 14 minutes with an increased arousal index. He had an increased percentage of stage II sleep and a reduced percentage of deep sleep and absence of REM sleep. He had  loud snoring. His total AHI was 66.5 per hour. Baseline oxygen saturation was 93% with a nadir of 81%. He was then titrated on CPAP from 5-8 cm of water pressure. His AHI was reduced to 0 events per hour on the final pressure. His sleep efficiency was 84%. He had an increased percentage of stage II sleep, 13.9% of deep sleep and 17.4% of REM sleep after CPAP. He had an average oxygen saturation of 96% with a nadir of 90%. He had no significant periodic leg movements of sleep or EKG changes. Based on the sleep test results I started him on CPAP at 8 cm with a nasal mask, size medium.  I reviewed his compliance data from 09/04/2013 through 10/13/2013 which is the last 40 days during which time he his CPAP every night except for one night. Percent used days greater than 4 hours was 85%, indicating very good compliance. Residual AHI was low at 1.2 per hour and air leak was low at 8 L per minute for the 95th percentile. Pressure again is 8 cm with EPR of 1. His average usage was 6 hours and 46 minutes.  I reviewed his sleep study performed at the [facility] on 04/26/2010: His sleep efficiency was 65.12% for the baseline portion of the sleep study. He had an AHI of 84.4 per hour. Average oxygen saturation was 90%, nadir was 72% in non-REM sleep at 57% and REM sleep. He was observed to snore loudly. He was placed on CPAP from 5 cm to 14 cm of water pressure. His AHI was 0 per hour on 14 cm of water pressure. His lowest oxygen saturation while on CPAP was 74%. He never got new supplies since 10/11.  His typical bedtime is reported in the daytime, as he works 3rd shift, from 7 PM ot 7 AM 3 days on, 4 days off, then 4 days on and 3 days off, and on his days off he sleeps during the night. He goes to bed around 9 AM to 5 PM on work days. He uses CPAP every night, all night and felt improved when he started. He travels to [place] once or twice a year and takes his CPAP, but he cannot find distilled water  there. His wife and 2 kids are in [place].    His Past Medical History Is Significant For: Past Medical History:  Diagnosis Date  . Apnea   . Asthma   . Hyperlipidemia    mild  . Hypertension   . OSA on CPAP 07/30/2013  . Snoring     His Past Surgical History Is Significant For: Past Surgical History:  Procedure Laterality Date  . COLONOSCOPY WITH PROPOFOL N/A 03/29/2018   Procedure:  COLONOSCOPY WITH PROPOFOL;  Surgeon: Juanita Craver, MD;  Location: [facility];  Service: Endoscopy;  Laterality: N/A;  . DIRECT LARYNGOSCOPY Right 06/06/2017   Procedure: MICRO DIRECT LARYNGOSCOPY WITH EXCISION OF VOCAL CORD MASS;  Surgeon: Leta Baptist, MD;  Location: [place];  Service: ENT;  Laterality: Right;  . POLYPECTOMY  03/29/2018   Procedure: POLYPECTOMY;  Surgeon: Juanita Craver, MD;  Location: [facility];  Service: Endoscopy;;  . THROAT SURGERY     approx five years ago unsure    His Family History Is Significant For: Family History  Problem Relation Age of Onset  . Hypertension Mother   . Hypertension Father   . Snoring Brother        all 3 brothers  . Snoring Sister        all 3 sisters    His Social History Is Significant For: Social History   Socioeconomic History  . Marital status: Married    Spouse name: Not on file  . Number of children: Not on file  . Years of education: Not on file  . Highest education level: Not on file  Occupational History  . Not on file  Tobacco Use  . Smoking status: Never Smoker  . Smokeless tobacco: Never Used  Vaping Use  . Vaping Use: Never used  Substance and Sexual Activity  . Alcohol use: Yes  . Drug use: No  . Sexual activity: Not on file  Other Topics Concern  . Not on file  Social History Narrative  . Not on file   Social Determinants of Health   Financial Resource Strain: Not on file  Food Insecurity: Not on file  Transportation Needs: Not on file  Physical Activity: Not on file  Stress: Not on file  Social  Connections: Not on file    His Allergies Are:  No Known Allergies:   His Current Medications Are:  Outpatient Encounter Medications as of 10/13/2020  Medication Sig  . amLODipine (NORVASC) 10 MG tablet Take 1 tablet (10 mg) by mouth daily.  Marland Kitchen losartan-hydrochlorothiazide (HYZAAR) 50-12.5 MG tablet Take 1 tablet by mouth daily.  . metoprolol tartrate (LOPRESSOR) 50 MG tablet Take one tablet by mouth daily  . sertraline (ZOLOFT) 100 MG tablet Take 1 tablet (100 mg total) by mouth daily.  . [DISCONTINUED] cetirizine (ZYRTEC) 10 MG tablet Take 1 tablet (10 mg total) by mouth daily. (Patient not taking: Reported on 10/13/2020)  . [DISCONTINUED] LINZESS 290 MCG CAPS capsule TK 1 C PO QD (Patient not taking: Reported on 10/13/2020)  . [DISCONTINUED] methocarbamol (ROBAXIN) 500 MG tablet Take 1 tablet (500 mg total) by mouth every 8 (eight) hours as needed. (Patient not taking: Reported on 10/13/2020)  . [DISCONTINUED] naproxen (NAPROSYN) 500 MG tablet Take 1 tablet (500 mg total) by mouth 2 (two) times daily. (Patient not taking: Reported on 10/13/2020)  . [DISCONTINUED] potassium chloride (KLOR-CON) 20 MEQ packet Take 20 mEq by mouth daily.  . [DISCONTINUED] triamcinolone cream (KENALOG) 0.1 % Apply 1 application topically 2 (two) times daily. (Patient not taking: Reported on 10/13/2020)   No facility-administered encounter medications on file as of 10/13/2020.  :   Review of Systems:  Out of a complete 14 point review of systems, all are reviewed and negative with the exception of these symptoms as listed below:  Review of Systems  Neurological:       Here for revisit on sleep. Interested in getting new machine.  Epworth Sleepiness Scale 0= would never doze 1=  slight chance of dozing 2= moderate chance of dozing 3= high chance of dozing  Sitting and reading:0 Watching TV:1 Sitting inactive in a public place (ex. Theater or meeting):0 As a passenger in a car for an hour without a  break:0 Lying down to rest in the afternoon:1 Sitting and talking to someone:0 Sitting quietly after lunch (no alcohol):0 In a car, while stopped in traffic:0 Total:2     Objective:  Neurological Exam  Physical Exam Physical Examination:   Vitals:   10/13/20 0850  BP: 125/79  Pulse: 63    General Examination: The patient is a very pleasant 55 y.o. male in no acute distress. He appears well-developed and well-nourished and well groomed.   HEENT: Normocephalic, atraumatic, pupils are equal, round and reactive to light, extraocular tracking is well-preserved, corrective eyeglasses in place.  Hearing is grossly intact.  Face is symmetric with normal facial animation.  Speech is clear without dysarthria, hypophonia or voice tremor.  He does have a raspy voice currently.  He reports that he recently started having congestion.  Neck is supple with full range of passive and active motion. There are no carotid bruits on auscultation. Oropharynx exam reveals: Mild mouth dryness, adequate dental hygiene and significant airway crowding.  Tongue protrudes centrally and palate elevates symmetrically. Tonsils are 2+ in size. Neck size is 18 and three-quarter inches.   Chest: Clear to auscultation without wheezing, rhonchi or crackles noted.  Heart: S1+S2+0, regular and normal without murmurs, rubs or gallops noted.   Abdomen: Soft, non-tender and non-distended with normal bowel sounds appreciated on auscultation.  Extremities: There is no pitting edema in the distal lower extremities bilaterally.   Skin: Warm and dry without trophic changes noted. There are no varicose veins.  Musculoskeletal: exam reveals no obvious joint deformities, tenderness or joint swelling.   Neurologically:  Mental status: The patient is awake, alert and oriented in all 4 spheres. His immediate and remote memory, attention, language skills and fund of knowledge are appropriate. There is no evidence of aphasia,  agnosia, apraxia or anomia. Speech is clear with normal prosody and enunciation. Thought process is linear. Mood is normal and affect is normal.  Cranial nerves II - XII are as described above under HEENT exam.  Motor exam: Normal bulk, strength and tone is noted. There is no tremor. Romberg is negative. Fine motor skills and coordination: Grossly intact.  Cerebellar testing: No dysmetria or intention tremor. There is no truncal or gait ataxia.  Sensory exam: intact to light touch in the upper and lower extremities.  Gait, station and balance: He stands easily. No veering to one side is noted. No leaning to one side is noted. Posture is age-appropriate and stance is narrow based. Gait shows normal stride length and normal pace. No problems turning are noted. Tandem walk is unremarkable.   Assessment and Plan:   In summary, Edward Erickson is a very pleasant 55 year old male with an underlying medical history of obesity, chronic constipation, anxiety, hypertension, asthma, and hyperlipidemia, who presents for reevaluation of his obstructive sleep apnea, which was deemed to be in the severe range by split-night sleep testing on 08/08/2013.  He has been compliant with CPAP for years.  His previous diagnosis for sleep apnea was from 2011.  He was on CPAP of 14 cm at the time, currently on 8 cm successfully.  He is compliant with treatment and continues to benefit from it.  He is commended for his treatment adherence.  He will  need a new machine as the motor life has exceeded the recommended time, machine is over 80 years old.   For his nasal congestion he is encouraged to use a saline nasal rinse system.  He has been using Afrin but is discouraged from using this daily, I explained to him that it can cause rebound swelling.   We talked about the importance of treating sleep apnea.  He is advised to follow-up in 3 months after starting his new machine.  If insurance mandates a sleep study in the interim,  we will call him and proceed with a home sleep test to reestablish his sleep apnea diagnosis and qualify for new equipment.  Once he has been seen for initial compliance, he can be seen yearly if all goes well.  I explained this to him.  I answered all his questions today and he was in agreement with this plan.  Thank you for allowing me to participate in the care of this nice patient. If I can be of any further assistance to you please do not hesitate to call me at [phone].  Sincerely,   Star Age, MD, PhD

## 2021-02-22 ENCOUNTER — Encounter: Payer: Self-pay | Admitting: Neurology

## 2021-02-23 ENCOUNTER — Telehealth: Payer: Self-pay | Admitting: *Deleted

## 2021-02-23 ENCOUNTER — Ambulatory Visit: Payer: Managed Care, Other (non HMO) | Admitting: Neurology

## 2021-02-23 ENCOUNTER — Encounter: Payer: Self-pay | Admitting: Neurology

## 2021-02-23 VITALS — BP 124/83 | HR 65 | Ht 66.0 in | Wt 222.2 lb

## 2021-02-23 DIAGNOSIS — Z9989 Dependence on other enabling machines and devices: Secondary | ICD-10-CM

## 2021-02-23 DIAGNOSIS — G4733 Obstructive sleep apnea (adult) (pediatric): Secondary | ICD-10-CM | POA: Diagnosis not present

## 2021-02-23 NOTE — Patient Instructions (Addendum)
It was nice to see you again today.  You are compliant with your new CPAP machine.  As discussed, I would like to increase your pressure from 8 cm to 9 cm to better control your sleep apnea events.   We will send the order to [facility], please also get in touch with them regarding your supplies to get them on a regular basis. Please call in about 2 months so we can review your compliance report on the new pressure setting.  We can do this remotely and provide feedback over the phone or via [place] if you would like.  Please continue to work on weight loss.  Please continue using your CPAP regularly. While your insurance requires that you use CPAP at least 4 hours each night on 70% of the nights, I recommend, that you not skip any nights and use it throughout the night if you can. Getting used to CPAP and staying with the treatment long term does take time and patience and discipline. Untreated obstructive sleep apnea when it is moderate to severe can have an adverse impact on cardiovascular health and raise her risk for heart disease, arrhythmias, hypertension, congestive heart failure, stroke and diabetes. Untreated obstructive sleep apnea causes sleep disruption, nonrestorative sleep, and sleep deprivation. This can have an impact on your day to day functioning and cause daytime sleepiness and impairment of cognitive function, memory loss, mood disturbance, and problems focussing. Using CPAP regularly can improve these symptoms.  We can see you in 1 year, you can see one of our nurse practitioners as you are stable.

## 2021-02-23 NOTE — Telephone Encounter (Signed)
Secure message sent to [employer] staff for them to process orders for PAP pressure change.

## 2021-02-23 NOTE — Progress Notes (Signed)
Subjective:    Patient ID: Edward Erickson is a 55 y.o. male.  HPI    Interim history:   Edward Erickson is a 55 year old right-handed gentleman with an underlying medical history of obesity, chronic constipation, anxiety, hypertension, asthma, and hyperlipidemia, who presents for follow-up consultation of his obstructive sleep apnea after establishing treatment with a new CPAP machine.  The patient is unaccompanied today.  I last saw him on 10/13/2020, at which time he was re-referred for sleep apnea after a long gap of several years.  He had been using CPAP therapy, he qualified for a new machine and I prescribed a new CPAP machine.  His set up date with his new ResMed air sense 11 was 12/15/2020.  Today, 02/23/21: I reviewed his CPAP compliance data from 01/23/2021 through 02/21/2021, which is a total of 30 days, during which time he used his machine every night with percent use days greater than 4 hours at 93%, indicating excellent compliance with an average usage of 7 hours and 49 minutes, residual AHI is suboptimal at 11.4/h, mostly obstructive events, leak on the low side with a 95th percentile at 3.1 L/min on a pressure of 8 cm with EPR of 3.  He reports doing really well.  He likes his new machine and is very pleased with his mask which is a Public affairs consultant N 20 large.  He is without new complaints.  He is motivated to continue with treatment.  He has had no recent medication changes or medical history changes.  He works nights.  Typically he tries to be in bed around 9 AM and rise time is around 5 PM.  He works from 7 PM to 7 AM.  The patient's allergies, current medications, family history, past medical history, past social history, past surgical history and problem list were reviewed and updated as appropriate.   Previously:   10/13/20: (He) reports doing well with CPAP therapy.  He was previously diagnosed with obstructive sleep apnea.  He had sleep testing through our clinic in February  2015, baseline AHI was 66.5 at the time, he was advised to start CPAP at 8 cm.  He has been lost to follow-up since 2015, he missed an appointment over 04/16/2014.  He was originally diagnosed with obstructive sleep apnea in 2011 and had an order CPAP machine which was set at 14 cm.  I reviewed her office note from 05/15/2020, at which time he saw Dr. Nolon Rod.  His Epworth sleepiness score is 2 out of 24.  He reports compliance with his current machine, which is over 63 years old.  He would like to get new equipment.  I reviewed his compliance data from 09/03/2020 through 10/02/2020, which is a total of 30 days, during which time he used his machine every night with percent use days greater than 4 hours at 87%, indicating very good compliance with an average usage of 7 hours and 33 minutes, residual AHI at goal at 1.7/h, leak on the low side with a 95th percentile at 6.5 L/min on a pressure of 8 cm without EPR.  He reports feeling well with regards to his sleep quality and sleep consolidation.  In fact, he feels like he cannot sleep without his CPAP.  He has seen an error message on his machine recently stating that the motor life has exceeded.  He uses a nasal mask.  He works nights, he works for a [employer], production.  He typically works from 7 PM to 7 AM.  He lives with his wife, his 43 year old son is currently staying with them, planning to go to med school.  His 54 year old daughter is in the [employer].  He is a non-smoker and drinks alcohol occasionally.  He drinks caffeine in the form of hot tea, 1 large cup per day.  He does not have recurrent headaches but does get up to go to the bathroom once or twice once he goes to bed.  Typical bedtime is between 9 and 9:30 AM.  He drops off his wife to work after he comes home from work which is around 7:30 AM.  If he has to pick her up he wakes up at 3:30 PM, if he does not have to pick her up he will sleep till 5 PM.    I saw him on 10/14/2013, at which time he  reported doing well with his new CPAP. He had adjusted well and was sleeping better. She was compliant with treatment. He likes his new machine better than the old one.  I reviewed his reviewed his compliance data from 01/16/14 to 04/14/14, which is a total of 89 days, during which time he used his CPAP every night. AHI was 1.1/h, average usage of 6:33 and leak acceptable. Compliance percentage of 83%. Pressure at 8 cm, EPR off.  I first met him on 07/30/2013, at which time he reported a diagnosis of severe obstructive sleep apnea in 2011. He has been using CPAP since then. He reported significant weight gain since his last sleep study and need for new supplies. I suggested he start using Flonase for significantly tight nasal passages and asked him to return for a sleep study. He had a split-night sleep study on 08/08/2013 and I went over her sleep study results with him in detail today. His baseline sleep efficiency was 82% with a latency to sleep of 13.5 minutes and wake after sleep onset of 14 minutes with an increased arousal index. He had an increased percentage of stage II sleep and a reduced percentage of deep sleep and absence of REM sleep. He had loud snoring. His total AHI was 66.5 per hour. Baseline oxygen saturation was 93% with a nadir of 81%. He was then titrated on CPAP from 5-8 cm of water pressure. His AHI was reduced to 0 events per hour on the final pressure. His sleep efficiency was 84%. He had an increased percentage of stage II sleep, 13.9% of deep sleep and 17.4% of REM sleep after CPAP. He had an average oxygen saturation of 96% with a nadir of 90%. He had no significant periodic leg movements of sleep or EKG changes. Based on the sleep test results I started him on CPAP at 8 cm with a nasal mask, size medium.  I reviewed his compliance data from 09/04/2013 through 10/13/2013 which is the last 40 days during which time he his CPAP every night except for one night. Percent used days greater  than 4 hours was 85%, indicating very good compliance. Residual AHI was low at 1.2 per hour and air leak was low at 8 L per minute for the 95th percentile. Pressure again is 8 cm with EPR of 1. His average usage was 6 hours and 46 minutes.  I reviewed his sleep study performed at the [facility] on 04/26/2010: His sleep efficiency was 65.12% for the baseline portion of the sleep study. He had an AHI of 84.4 per hour. Average oxygen saturation was 90%, nadir was 72% in non-REM  sleep at 57% and REM sleep. He was observed to snore loudly. He was placed on CPAP from 5 cm to 14 cm of water pressure. His AHI was 0 per hour on 14 cm of water pressure. His lowest oxygen saturation while on CPAP was 74%. He never got new supplies since 10/11.  His typical bedtime is reported in the daytime, as he works 3rd shift, from 7 PM ot 7 AM 3 days on, 4 days off, then 4 days on and 3 days off, and on his days off he sleeps during the night. He goes to bed around 9 AM to 5 PM on work days. He uses CPAP every night, all night and felt improved when he started. He travels to [place] once or twice a year and takes his CPAP, but he cannot find distilled water there. His wife and 2 kids are in [place].    His Past Medical History Is Significant For: Past Medical History:  Diagnosis Date   Apnea    Asthma    Hyperlipidemia    mild   Hypertension    OSA on CPAP 07/30/2013   Snoring     His Past Surgical History Is Significant For: Past Surgical History:  Procedure Laterality Date   COLONOSCOPY WITH PROPOFOL N/A 03/29/2018   Procedure: COLONOSCOPY WITH PROPOFOL;  Surgeon: Juanita Craver, MD;  Location: [facility];  Service: Endoscopy;  Laterality: N/A;   DIRECT LARYNGOSCOPY Right 06/06/2017   Procedure: MICRO DIRECT LARYNGOSCOPY WITH EXCISION OF VOCAL CORD MASS;  Surgeon: Leta Baptist, MD;  Location: [facility];  Service: ENT;  Laterality: Right;   POLYPECTOMY  03/29/2018   Procedure:  POLYPECTOMY;  Surgeon: Juanita Craver, MD;  Location: [facility];  Service: Endoscopy;;   THROAT SURGERY     approx five years ago unsure    His Family History Is Significant For: Family History  Problem Relation Age of Onset   Hypertension Mother    Hypertension Father    Snoring Brother        all 3 brothers   Snoring Sister        all 3 sisters    His Social History Is Significant For: Social History   Socioeconomic History   Marital status: Married    Spouse name: Not on file   Number of children: Not on file   Years of education: Not on file   Highest education level: Not on file  Occupational History   Not on file  Tobacco Use   Smoking status: Never   Smokeless tobacco: Never  Vaping Use   Vaping Use: Never used  Substance and Sexual Activity   Alcohol use: Yes   Drug use: No   Sexual activity: Not on file  Other Topics Concern   Not on file  Social History Narrative   Not on file   Social Determinants of Health   Financial Resource Strain: Not on file  Food Insecurity: Not on file  Transportation Needs: Not on file  Physical Activity: Not on file  Stress: Not on file  Social Connections: Not on file    His Allergies Are:  No Known Allergies:   His Current Medications Are:  Outpatient Encounter Medications as of 02/23/2021  Medication Sig   amLODipine (NORVASC) 10 MG tablet Take 1 tablet (10 mg) by mouth daily.   losartan-hydrochlorothiazide (HYZAAR) 50-12.5 MG tablet Take 1 tablet by mouth daily.   metoprolol tartrate (LOPRESSOR) 50 MG tablet Take one tablet by mouth daily  sertraline (ZOLOFT) 100 MG tablet Take 1 tablet (100 mg total) by mouth daily.   No facility-administered encounter medications on file as of 02/23/2021.  :  Review of Systems:  Out of a complete 14 point review of systems, all are reviewed and negative with the exception of these symptoms as listed below:   Review of Systems  Neurological:        Pt is here for follow  up visit for CPAP pt has no complaints or issue  FSS: 5     Objective:  Neurological Exam  Physical Exam Physical Examination:   Vitals:   02/23/21 1016  BP: 124/83  Pulse: 65    General Examination: The patient is a very pleasant 55 y.o. male in no acute distress. He appears well-developed and well-nourished and well groomed.   HEENT: Normocephalic, atraumatic, pupils are equal, round and reactive to light, extraocular tracking is well-preserved, corrective eyeglasses in place.  Hearing is grossly intact.  Face is symmetric with normal facial animation.  Speech is clear without dysarthria, hypophonia or voice tremor.  He does have a raspy voice currently.  He reports that he recently started having congestion.  Neck is supple with full range of passive and active motion. There are no carotid bruits on auscultation. Oropharynx exam reveals: Mild mouth dryness, adequate dental hygiene and significant airway crowding.  Tongue protrudes centrally and palate elevates symmetrically. Tonsils are 2+ in size. Neck size is 18 and three-quarter inches.    Chest: Clear to auscultation without wheezing, rhonchi or crackles noted.   Heart: S1+S2+0, regular and normal without murmurs, rubs or gallops noted.    Abdomen: Soft, non-tender and non-distended with normal bowel sounds appreciated on auscultation.   Extremities: There is no pitting edema in the distal lower extremities bilaterally.    Skin: Warm and dry without trophic changes noted. There are no varicose veins.   Musculoskeletal: exam reveals no obvious joint deformities, tenderness or joint swelling.    Neurologically:  Mental status: The patient is awake, alert and oriented in all 4 spheres. His immediate and remote memory, attention, language skills and fund of knowledge are appropriate. There is no evidence of aphasia, agnosia, apraxia or anomia. Speech is clear with normal prosody and enunciation. Thought process is linear. Mood is  normal and affect is normal.  Cranial nerves II - XII are as described above under HEENT exam.  Motor exam: Normal bulk, strength and tone is noted. There is no tremor. Romberg is negative. Fine motor skills and coordination: Grossly intact.  Cerebellar testing: No dysmetria or intention tremor. There is no truncal or gait ataxia.  Sensory exam: intact to light touch in the upper and lower extremities.  Gait, station and balance: He stands easily. No veering to one side is noted. No leaning to one side is noted. Posture is age-appropriate and stance is narrow based. Gait shows normal stride length and normal pace. No problems turning are noted. Tandem walk is unremarkable.    Assessment and Plan:    In summary, Edward Erickson is a very pleasant 55 year old male with an underlying medical history of obesity, chronic constipation, anxiety, hypertension, asthma, and hyperlipidemia, who presents for follow-up consultation of his obstructive sleep apnea.  Split-night sleep testing on 08/08/2013 indicated severe obstructive sleep apnea.  He has been compliant on CPAP therapy for years.  Prior to that, he was diagnosed in or around 2011.  He was on CPAP therapy of 14 cm at the time,  currently on 8 cm but he does have residual sleep apnea events.  He is agreeable to increasing the pressure to 9 cm, we will send the order to his current DME company.  His usage is excellent, he is commended for his treatment adherence.  He established treatment with a new CPAP machine in June 2022.  He is advised to call our office in about 2 months and we can review his compliance data remotely on the new pressure setting.  He is advised to follow-up routinely in this office in about 1 year to see one of our nurse practitioners.  We talked about his sleep apnea diagnosis, maintaining a healthy lifestyle and pursuing weight loss.  We reviewed his compliance data in detail.  I answered all his questions today and he was in  agreement with the plan. I spent 30 minutes in total face-to-face time and in reviewing records during pre-charting, more than 50% of which was spent in counseling and coordination of care, reviewing test results, reviewing medications and treatment regimen and/or in discussing or reviewing the diagnosis of OSA, the prognosis and treatment options. Pertinent laboratory and imaging test results that were available during this visit with the patient were reviewed by me and considered in my medical decision making (see chart for details).
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
## 2022-02-22 NOTE — Progress Notes (Deleted)
PATIENT: Edward Erickson DOB: October 23, 1965  REASON FOR VISIT: follow up HISTORY FROM: patient  No chief complaint on file.    HISTORY OF PRESENT ILLNESS:  02/22/22 ALL:  ELLIAS Erickson is a 56 y.o. male here today for follow up for OSA on CPAP.      HISTORY: (copied from Dr Guadelupe Sabin previous note)  Edward Erickson is a 56 year old right-handed gentleman with an underlying medical history of obesity, chronic constipation, anxiety, hypertension, asthma, and hyperlipidemia, who presents for follow-up consultation of his obstructive sleep apnea after establishing treatment with a new CPAP machine.  The patient is unaccompanied today.  I last saw him on 10/13/2020, at which time he was re-referred for sleep apnea after a long gap of several years.  He had been using CPAP therapy, he qualified for a new machine and I prescribed a new CPAP machine.  His set up date with his new ResMed air sense 11 was 12/15/2020.   Today, 02/23/21: I reviewed his CPAP compliance data from 01/23/2021 through 02/21/2021, which is a total of 30 days, during which time he used his machine every night with percent use days greater than 4 hours at 93%, indicating excellent compliance with an average usage of 7 hours and 49 minutes, residual AHI is suboptimal at 11.4/h, mostly obstructive events, leak on the low side with a 95th percentile at 3.1 L/min on a pressure of 8 cm with EPR of 3.  He reports doing really well.  He likes his new machine and is very pleased with his mask which is a Public affairs consultant N 20 large.  He is without new complaints.  He is motivated to continue with treatment.  He has had no recent medication changes or medical history changes.  He works nights.  Typically he tries to be in bed around 9 AM and rise time is around 5 PM.  He works from 7 PM to 7 AM.   REVIEW OF SYSTEMS: Out of a complete 14 system review of symptoms, the patient complains only of the following symptoms, and all other  reviewed systems are negative.  ESS:  ALLERGIES: No Known Allergies  HOME MEDICATIONS: Outpatient Medications Prior to Visit  Medication Sig Dispense Refill   amLODipine (NORVASC) 10 MG tablet Take 1 tablet (10 mg) by mouth daily. 30 tablet 0   losartan-hydrochlorothiazide (HYZAAR) 50-12.5 MG tablet Take 1 tablet by mouth daily. 90 tablet 0   metoprolol tartrate (LOPRESSOR) 50 MG tablet Take one tablet by mouth daily 180 tablet 1   sertraline (ZOLOFT) 100 MG tablet Take 1 tablet (100 mg total) by mouth daily. 90 tablet 3   No facility-administered medications prior to visit.    PAST MEDICAL HISTORY: Past Medical History:  Diagnosis Date   Apnea    Asthma    Hyperlipidemia    mild   Hypertension    OSA on CPAP 07/30/2013   Snoring     PAST SURGICAL HISTORY: Past Surgical History:  Procedure Laterality Date   COLONOSCOPY WITH PROPOFOL N/A 03/29/2018   Procedure: COLONOSCOPY WITH PROPOFOL;  Surgeon: Juanita Craver, MD;  Location: [facility];  Service: Endoscopy;  Laterality: N/A;   DIRECT LARYNGOSCOPY Right 06/06/2017   Procedure: MICRO DIRECT LARYNGOSCOPY WITH EXCISION OF VOCAL CORD MASS;  Surgeon: Leta Baptist, MD;  Location: [place];  Service: ENT;  Laterality: Right;   POLYPECTOMY  03/29/2018   Procedure: POLYPECTOMY;  Surgeon: Juanita Craver, MD;  Location: [facility];  Service: Endoscopy;;  THROAT SURGERY     approx five years ago unsure    FAMILY HISTORY: Family History  Problem Relation Age of Onset   Hypertension Mother    Hypertension Father    Snoring Brother        all 3 brothers   Snoring Sister        all 3 sisters    SOCIAL HISTORY: Social History   Socioeconomic History   Marital status: Married    Spouse name: Not on file   Number of children: Not on file   Years of education: Not on file   Highest education level: Not on file  Occupational History   Not on file  Tobacco Use   Smoking status: Never   Smokeless tobacco: Never   Vaping Use   Vaping Use: Never used  Substance and Sexual Activity   Alcohol use: Yes   Drug use: No   Sexual activity: Not on file  Other Topics Concern   Not on file  Social History Narrative   Not on file   Social Determinants of Health   Financial Resource Strain: Not on file  Food Insecurity: Not on file  Transportation Needs: Not on file  Physical Activity: Not on file  Stress: Not on file  Social Connections: Not on file  Intimate Partner Violence: Not on file     PHYSICAL EXAM  There were no vitals filed for this visit. There is no height or weight on file to calculate BMI.  Generalized: Well developed, in no acute distress  Cardiology: normal rate and rhythm, no murmur noted Respiratory: clear to auscultation bilaterally  Neurological examination  Mentation: Alert oriented to time, place, history taking. Follows all commands speech and language fluent Cranial nerve II-XII: Pupils were equal round reactive to light. Extraocular movements were full, visual field were full  Motor: The motor testing reveals 5 over 5 strength of all 4 extremities. Good symmetric motor tone is noted throughout.  Gait and station: Gait is normal.    DIAGNOSTIC DATA (LABS, IMAGING, TESTING) - I reviewed patient records, labs, notes, testing and imaging myself where available.      No data to display           Lab Results  Component Value Date   WBC 4.7 01/09/2017   HGB 13.4 01/09/2017   HCT 42.0 01/09/2017   MCV 84 01/09/2017   PLT 152 01/09/2017      Component Value Date/Time   NA 142 09/24/2019 0953   K 3.7 09/24/2019 0953   CL 101 09/24/2019 0953   CO2 25 09/24/2019 0953   GLUCOSE 84 09/24/2019 0953   GLUCOSE 126 (H) 06/02/2017 1208   BUN 9 09/24/2019 0953   CREATININE 0.87 09/24/2019 0953   CREATININE 0.85 03/01/2016 0923   CALCIUM 9.0 09/24/2019 0953   PROT 7.7 09/24/2019 0953   ALBUMIN 4.2 09/24/2019 0953   AST 27 09/24/2019 0953   ALT 24 09/24/2019 0953    ALKPHOS 120 (H) 09/24/2019 0953   BILITOT 0.3 09/24/2019 0953   GFRNONAA 99 09/24/2019 0953   GFRNONAA >89 03/01/2016 0923   GFRAA 114 09/24/2019 0953   GFRAA >89 03/01/2016 0923   Lab Results  Component Value Date   CHOL 197 09/24/2019   HDL 48 09/24/2019   LDLCALC 120 (H) 09/24/2019   TRIG 165 (H) 09/24/2019   CHOLHDL 4.1 09/24/2019   Lab Results  Component Value Date   HGBA1C 6.4 (H) 09/24/2019   No results found  for: "VITAMINB12" Lab Results  Component Value Date   TSH 0.517 01/09/2017     ASSESSMENT AND PLAN 56 y.o. year old male  has a past medical history of Apnea, Asthma, Hyperlipidemia, Hypertension, OSA on CPAP (07/30/2013), and Snoring. here with   No diagnosis found.    Edward Erickson is doing well on CPAP therapy. Compliance report reveals ***. He was encouraged to continue using CPAP nightly and for greater than 4 hours each night. We will update supply orders as indicated. Risks of untreated sleep apnea review and education materials provided. Healthy lifestyle habits encouraged. He will follow up in 1 year, sooner if needed. He verbalizes understanding and agreement with this plan.    No orders of the defined types were placed in this encounter.    No orders of the defined types were placed in this encounter.     Debbora Presto, FNP-C 02/22/2022, 2:06 PM [facility] [address] [place], [place] [address] [phone]

## 2022-02-23 ENCOUNTER — Ambulatory Visit: Payer: Self-pay | Admitting: Family Medicine

## 2022-02-23 DIAGNOSIS — G4733 Obstructive sleep apnea (adult) (pediatric): Secondary | ICD-10-CM

## 2022-05-24 ENCOUNTER — Encounter: Payer: Self-pay | Admitting: *Deleted

## 2023-08-31 ENCOUNTER — Ambulatory Visit: Payer: Self-pay | Admitting: Neurology

## 2023-08-31 VITALS — BP 131/83 | HR 75 | Ht 66.0 in | Wt 222.8 lb

## 2023-08-31 DIAGNOSIS — G4733 Obstructive sleep apnea (adult) (pediatric): Secondary | ICD-10-CM | POA: Diagnosis not present

## 2023-08-31 NOTE — Progress Notes (Signed)
 Subjective:    Patient ID: Edward Erickson is a 58 y.o. male.  HPI    Interim history:   Edward Erickson is a 58 year old male with an underlying medical history of hypertension, hyperlipidemia, asthma, anxiety, and obesity, who presents for follow-up consultation of his obstructive sleep apnea, on PAP therapy.  The patient is unaccompanied today and presents after a longer gap of over 2-1/2 years.  He was last seen in this clinic in August 2022.   Today, 08/31/2023: I reviewed his CPAP compliance data from 08/01/2023 through 08/30/2023, which is a total of 30 days, during which time he used his CPAP every night with percent use days greater than 4 hours at 97%, indicating excellent compliance with an average usage of 7 hours and 28 minutes, residual AHI at goal at 2.8/h, leak acceptable with the 95th percentile at 20.7 L/min, pressure of 9 cm with EPR of 3.  He reports well with his CPAP.  He is compliant with treatment.  He is up-to-date with his supplies.  He is somewhat tired currently because he usually works nights and sleeps during the day.  He typically uses a nasal mask.  His DME provider is [facility].  He goes to bed generally between 9 and 10 AM and rise time is between 3:30 PM and 5 PM.  Sometimes he has to pick up his wife from work.  He works from 7 PM to 7 AM.  He tries to hydrate well and usually drinks 1 serving of tea during his work hours.  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  The patient's allergies, current medications, family history, past medical history, past social history, past surgical history and problem list were reviewed and updated as appropriate.    Previously:   02/23/2021: 58 year old right-handed gentleman with an underlying medical history of obesity, chronic constipation, anxiety, hypertension, asthma, and hyperlipidemia, who presents for follow-up consultation of his obstructive sleep apnea after establishing treatment  with a new CPAP machine.  The patient is unaccompanied today.  I last saw him on 10/13/2020, at which time he was re-referred for sleep apnea after a long gap of several years.  He had been using CPAP therapy, he qualified for a new machine and I prescribed a new CPAP machine.  His set up date with his new ResMed air sense 11 was 12/15/2020. I reviewed his CPAP compliance data from 01/23/2021 through 02/21/2021, which is a total of 30 days, during which time he used his machine every night with percent use days greater than 4 hours at 93%, indicating excellent compliance with an average usage of 7 hours and 49 minutes, residual AHI is suboptimal at 11.4/h, mostly obstructive events, leak on the low side with a 95th percentile at 3.1 L/min on a pressure of 8 cm with EPR of 3.  He reports doing really well.  He likes his new machine and is very pleased with his mask which is a Chiropodist N 20 large.  He is without new complaints.  He is motivated to continue with treatment.  He has had no recent medication changes or medical history changes.  He works nights.  Typically he tries to be in bed around 9 AM and rise time is around 5 PM.  He works from 7 PM to 7 AM.    10/13/20: (He) reports doing well with CPAP therapy.  He was previously diagnosed with obstructive sleep apnea.  He had  sleep testing through our clinic in February 2015, baseline AHI was 66.5 at the time, he was advised to start CPAP at 8 cm.  He has been lost to follow-up since 2015, he missed an appointment over 04/16/2014.  He was originally diagnosed with obstructive sleep apnea in 2011 and had an order CPAP machine which was set at 14 cm.  I reviewed her office note from 05/15/2020, at which time he saw Dr. Creta Levin.  His Epworth sleepiness score is 2 out of 24.  He reports compliance with his current machine, which is over 39 years old.  He would like to get new equipment.  I reviewed his compliance data from 09/03/2020 through 10/02/2020, which is a  total of 30 days, during which time he used his machine every night with percent use days greater than 4 hours at 87%, indicating very good compliance with an average usage of 7 hours and 33 minutes, residual AHI at goal at 1.7/h, leak on the low side with a 95th percentile at 6.5 L/min on a pressure of 8 cm without EPR.  He reports feeling well with regards to his sleep quality and sleep consolidation.  In fact, he feels like he cannot sleep without his CPAP.  He has seen an error message on his machine recently stating that the motor life has exceeded.  He uses a nasal mask.  He works nights, he works for a [employer].  He typically works from 7 PM to 7 AM.  He lives with his wife, his 63 year old son is currently staying with them, planning to go to med school.  His 32 year old daughter is in the [employer].  He is a non-smoker and drinks alcohol occasionally.  He drinks caffeine in the form of hot tea, 1 large cup per day.  He does not have recurrent headaches but does get up to go to the bathroom once or twice once he goes to bed.  Typical bedtime is between 9 and 9:30 AM.  He drops off his wife to work after he comes home from work which is around 7:30 AM.  If he has to pick her up he wakes up at 3:30 PM, if he does not have to pick her up he will sleep till 5 PM.     I saw him on 10/14/2013, at which time he reported doing well with his new CPAP. He had adjusted well and was sleeping better. She was compliant with treatment. He likes his new machine better than the old one.  I reviewed his reviewed his compliance data from 01/16/14 to 04/14/14, which is a total of 89 days, during which time he used his CPAP every night. AHI was 1.1/h, average usage of 6:33 and leak acceptable. Compliance percentage of 83%. Pressure at 8 cm, EPR off.  I first met him on 07/30/2013, at which time he reported a diagnosis of severe obstructive sleep apnea in 2011. He has been using CPAP since then. He reported  significant weight gain since his last sleep study and need for new supplies. I suggested he start using Flonase for significantly tight nasal passages and asked him to return for a sleep study. He had a split-night sleep study on 08/08/2013 and I went over her sleep study results with him in detail today. His baseline sleep efficiency was 82% with a latency to sleep of 13.5 minutes and wake after sleep onset of 14 minutes with an increased arousal index. He had an increased percentage of stage II  sleep and a reduced percentage of deep sleep and absence of REM sleep. He had loud snoring. His total AHI was 66.5 per hour. Baseline oxygen saturation was 93% with a nadir of 81%. He was then titrated on CPAP from 5-8 cm of water pressure. His AHI was reduced to 0 events per hour on the final pressure. His sleep efficiency was 84%. He had an increased percentage of stage II sleep, 13.9% of deep sleep and 17.4% of REM sleep after CPAP. He had an average oxygen saturation of 96% with a nadir of 90%. He had no significant periodic leg movements of sleep or EKG changes. Based on the sleep test results I started him on CPAP at 8 cm with a nasal mask, size medium.  I reviewed his compliance data from 09/04/2013 through 10/13/2013 which is the last 40 days during which time he his CPAP every night except for one night. Percent used days greater than 4 hours was 85%, indicating very good compliance. Residual AHI was low at 1.2 per hour and air leak was low at 8 L per minute for the 95th percentile. Pressure again is 8 cm with EPR of 1. His average usage was 6 hours and 46 minutes.  I reviewed his sleep study performed at the [facility] on 04/26/2010: His sleep efficiency was 65.12% for the baseline portion of the sleep study. He had an AHI of 84.4 per hour. Average oxygen saturation was 90%, nadir was 72% in non-REM sleep at 57% and REM sleep. He was observed to snore loudly. He was placed on CPAP from 5  cm to 14 cm of water pressure. His AHI was 0 per hour on 14 cm of water pressure. His lowest oxygen saturation while on CPAP was 74%. He never got new supplies since 10/11.  His typical bedtime is reported in the daytime, as he works 3rd shift, from 7 PM ot 7 AM 3 days on, 4 days off, then 4 days on and 3 days off, and on his days off he sleeps during the night. He goes to bed around 9 AM to 5 PM on work days. He uses CPAP every night, all night and felt improved when he started. He travels to [place] once or twice a year and takes his CPAP, but he cannot find distilled water there. His wife and 2 kids are in [place].   His Past Medical History Is Significant For: Past Medical History:  Diagnosis Date   Apnea    Asthma    Hyperlipidemia    mild   Hypertension    OSA on CPAP 07/30/2013   Snoring     His Past Surgical History Is Significant For: Past Surgical History:  Procedure Laterality Date   COLONOSCOPY WITH PROPOFOL N/A 03/29/2018   Procedure: COLONOSCOPY WITH PROPOFOL;  Surgeon: Charna Elizabeth, MD;  Location: [facility];  Service: Endoscopy;  Laterality: N/A;   DIRECT LARYNGOSCOPY Right 06/06/2017   Procedure: MICRO DIRECT LARYNGOSCOPY WITH EXCISION OF VOCAL CORD MASS;  Surgeon: Newman Pies, MD;  Location: [facility];  Service: ENT;  Laterality: Right;   POLYPECTOMY  03/29/2018   Procedure: POLYPECTOMY;  Surgeon: Charna Elizabeth, MD;  Location: [facility];  Service: Endoscopy;;   THROAT SURGERY     approx five years ago unsure    His Family History Is Significant For: Family History  Problem Relation Age of Onset   Hypertension Mother    Hypertension Father    Snoring Brother  all 3 brothers   Snoring Sister        all 3 sisters    His Social History Is Significant For: Social History   Socioeconomic History   Marital status: Married    Spouse name: Not on file   Number of children: Not on file   Years of education: Not on file   Highest education level:  Not on file  Occupational History   Not on file  Tobacco Use   Smoking status: Never   Smokeless tobacco: Never  Vaping Use   Vaping status: Never Used  Substance and Sexual Activity   Alcohol use: Yes   Drug use: No   Sexual activity: Not on file  Other Topics Concern   Not on file  Social History Narrative   Not on file   Social Drivers of Health   Financial Resource Strain: Low Risk  (11/29/2022)   Received from [facility]   Overall Financial Resource Strain (CARDIA)    Difficulty of Paying Living Expenses: Not hard at all  Food Insecurity: No Food Insecurity (11/29/2022)   Received from [facility]   Hunger Vital Sign    Worried About Running Out of Food in the Last Year: Never true    Ran Out of Food in the Last Year: Never true  Transportation Needs: No Transportation Needs (11/29/2022)   Received from [facility] - Transportation    Lack of Transportation (Medical): No    Lack of Transportation (Non-Medical): No  Physical Activity: Not on file  Stress: Not on file  Social Connections: Unknown (10/26/2021)   Received from [facility]   Social Network    Social Network: Not on file    His Allergies Are:  No Known Allergies:   His Current Medications Are:  Outpatient Encounter Medications as of 08/31/2023  Medication Sig   amLODipine (NORVASC) 10 MG tablet Take 1 tablet (10 mg) by mouth daily.   losartan-hydrochlorothiazide (HYZAAR) 50-12.5 MG tablet Take 1 tablet by mouth daily.   metoprolol tartrate (LOPRESSOR) 50 MG tablet Take one tablet by mouth daily   sertraline (ZOLOFT) 100 MG tablet Take 1 tablet (100 mg total) by mouth daily.   No facility-administered encounter medications on file as of 08/31/2023.  :  Review of Systems:  Out of a complete 14 point review of systems, all are reviewed and negative with the exception of these symptoms as listed below:  Review of Systems  Neurological:        Patient in room #5 and alone. Patient states  he is well and stable, with no new concerns. ESS- 15, FSS-18    Objective:  Neurological Exam  Physical Exam Physical Examination:   Vitals:   08/31/23 1317  BP: 131/83  Pulse: 75   General Examination: The patient is a very pleasant 58 y.o. male in no acute distress. He appears well-developed and well-nourished and well groomed.   HEENT: Normocephalic, atraumatic, pupils are equal, round and reactive to light, extraocular tracking is well-preserved, corrective eyeglasses in place.  Hearing is grossly intact.  Face is symmetric with normal facial animation.  Speech is clear without dysarthria, hypophonia or voice tremor.  He does have a raspy voice.  Neck is supple with full range of passive and active motion. There are no carotid bruits on auscultation. Oropharynx exam reveals: Mild mouth dryness, adequate dental hygiene and significant airway crowding.  Tongue protrudes centrally and palate elevates symmetrically. Tonsils are 2+ in size.  Chest: Clear to auscultation without wheezing, rhonchi or crackles noted.   Heart: S1+S2+0, regular and normal without murmurs, rubs or gallops noted.    Abdomen: Soft, non-tender and non-distended with normal bowel sounds appreciated on auscultation.   Extremities: There is trace edema in the distal lower extremities bilaterally.    Skin: Warm and dry without trophic changes noted. There are no varicose veins.   Musculoskeletal: exam reveals no obvious joint deformities, tenderness or joint swelling.    Neurologically:  Mental status: The patient is awake, alert and oriented in all 4 spheres. His immediate and remote memory, attention, language skills and fund of knowledge are appropriate. There is no evidence of aphasia, agnosia, apraxia or anomia. Speech is clear with normal prosody and enunciation. Thought process is linear. Mood is normal and affect is normal.  Cranial nerves II - XII are as described above under HEENT exam.  Motor exam:  Normal bulk, strength and tone is noted. There is no obvious resting or action tremor. Fine motor skills and coordination: Grossly intact.  Cerebellar testing: No dysmetria or intention tremor. There is no truncal or gait ataxia.  Sensory exam: intact to light touch in the upper and lower extremities.  Gait, station and balance: He stands easily. No veering to one side is noted. No leaning to one side is noted. Posture is age-appropriate and stance is narrow based. Gait shows normal stride length and normal pace. No problems turning are noted.    Assessment and Plan:    In summary, Edward Erickson is a very pleasant 58 year old male with an underlying medical history of obesity, chronic constipation, anxiety, hypertension, asthma, and hyperlipidemia, who presents for follow-up consultation of his longstanding history of obstructive sleep apnea, established on home CPAP therapy at a pressure of 9 cm with full compliance and good apnea control.  His Split-night sleep testing on 08/08/2013 indicated severe obstructive sleep apnea.   Prior to that, he was diagnosed in or around 2011.  He was on CPAP therapy of 14 cm at the time, currently on 9 cm, we increased this from 8 cm back in 2022.  He is commended for his treatment adherence. He established treatment with a new CPAP machine in June 2022. He is advised to follow-up routinely in this office in about 1 year to see one of our nurse practitioners. I answered all his questions today and he was in agreement with the plan.  I spent 30 minutes in total face-to-face time and in reviewing records during pre-charting, more than 50% of which was spent in counseling and coordination of care, reviewing test results, reviewing medications and treatment regimen and/or in discussing or reviewing the diagnosis of OSA, the prognosis and treatment options. Pertinent laboratory and imaging test results that were available during this visit with the patient were reviewed  by me and considered in my medical decision making (see chart for details).

## 2023-08-31 NOTE — Patient Instructions (Signed)

## 2023-11-13 ENCOUNTER — Ambulatory Visit: Payer: Self-pay | Admitting: Neurology

## 2024-09-05 ENCOUNTER — Ambulatory Visit: Admitting: Adult Health
